# Patient Record
Sex: Male | Born: 1960 | Race: Black or African American | Hispanic: No | Smoking: Former smoker
Health system: Southern US, Community
[De-identification: ages and names within clinical notes are randomized; demographics above are authoritative.]

## PROBLEM LIST (undated history)

## (undated) DIAGNOSIS — E785 Hyperlipidemia, unspecified: Secondary | ICD-10-CM

## (undated) DIAGNOSIS — B019 Varicella without complication: Secondary | ICD-10-CM

## (undated) DIAGNOSIS — R51 Headache: Secondary | ICD-10-CM

## (undated) HISTORY — DX: Varicella without complication: B01.9

## (undated) HISTORY — DX: Hyperlipidemia, unspecified: E78.5

## (undated) HISTORY — DX: Headache: R51

## (undated) HISTORY — PX: WISDOM TOOTH EXTRACTION: SHX21

---

## 1999-11-06 ENCOUNTER — Ambulatory Visit (HOSPITAL_COMMUNITY): Admission: RE | Admit: 1999-11-06 | Discharge: 1999-11-06 | Payer: Self-pay | Admitting: *Deleted

## 2012-01-02 ENCOUNTER — Ambulatory Visit (INDEPENDENT_AMBULATORY_CARE_PROVIDER_SITE_OTHER): Payer: BC Managed Care – PPO | Admitting: Family Medicine

## 2012-01-02 VITALS — BP 158/73 | HR 67 | Temp 97.5°F | Resp 20 | Ht 66.5 in | Wt 241.0 lb

## 2012-01-02 DIAGNOSIS — M778 Other enthesopathies, not elsewhere classified: Secondary | ICD-10-CM

## 2012-01-02 DIAGNOSIS — M719 Bursopathy, unspecified: Secondary | ICD-10-CM

## 2012-01-02 DIAGNOSIS — M67919 Unspecified disorder of synovium and tendon, unspecified shoulder: Secondary | ICD-10-CM

## 2012-01-02 MED ORDER — PREDNISONE 20 MG PO TABS: ORAL_TABLET | ORAL | Status: DC

## 2012-01-02 NOTE — Patient Instructions (Addendum)
Achilles Tendinitis Tendinitis a swelling and soreness of the tendon. The pain in the tendon (cord-like structure which attaches muscle to bone) is produced by tiny tears and the inflammation present in that tendon. It commonly occurs at the shoulders, heels, and elbows. It is usually caused by overusing the tendon and joint involved. Achilles tendinitis involves the Achilles tendon. This is the large tendon in the back of the leg just above the foot. It attaches the large muscles of the lower leg to the heel bone (called calcaneus).  This diagnosis (learning what is wrong) is made by examination. X-rays will be generally be normal if only tendinitis is present. HOME CARE INSTRUCTIONS   Apply ice to the injury for 15 to 20 minutes, 3 to 4 times per day. Put the ice in a plastic bag and place a towel between the bag of ice and your skin.  Try to avoid use other than gentle range of motion while the tendon is painful. Do not resume use until instructed by your caregiver. Then begin use gradually. Do not increase use to the point of pain. If pain does develop, decrease use and continue the above measures. Gradually increase activities that do not cause discomfort until you gradually achieve normal use.  Only take over-the-counter or prescription medicines for pain, discomfort, or fever as directed by your caregiver. SEEK MEDICAL CARE IF:   Your pain and swelling increase or pain is uncontrolled with medications.  You develop new, unexplained problems (symptoms) or an increase of the symptoms that brought you to your caregiver.  You develop an inability to move your toes or foot, develop warmth and swelling in your foot, or begin running an unexplained temperature. MAKE SURE YOU:   Understand these instructions.  Will watch your condition.  Will get help right away if you are not doing well or get worse. Document Released: 10/03/2004 Document Revised: 03/18/2011 Document Reviewed:  08/12/2007 [facility] Patient Information 2013 [place], [place]. Bursitis Bursitis is a swelling and soreness (inflammation) of a fluid-filled sac (bursa) that overlies and protects a joint. It can be caused by injury, overuse of the joint, arthritis or infection. The joints most likely to be affected are the elbows, shoulders, hips and knees. HOME CARE INSTRUCTIONS   Apply ice to the affected area for 15 to 20 minutes each hour while awake for 2 days. Put the ice in a plastic bag and place a towel between the bag of ice and your skin.  Rest the injured joint as much as possible, but continue to put the joint through a full range of motion, 4 times per day. (The shoulder joint especially becomes rapidly "frozen" if not used.) When the pain lessens, begin normal slow movements and usual activities.  Only take over-the-counter or prescription medicines for pain, discomfort or fever as directed by your caregiver.  Your caregiver may recommend draining the bursa and injecting medicine into the bursa. This may help the healing process.  Follow all instructions for follow-up with your caregiver. This includes any orthopedic referrals, physical therapy and rehabilitation. Any delay in obtaining necessary care could result in a delay or failure of the bursitis to heal and chronic pain. SEEK IMMEDIATE MEDICAL CARE IF:   Your pain increases even during treatment.  You develop an oral temperature above 102 F (38.9 C) and have heat and inflammation over the involved bursa. MAKE SURE YOU:   Understand these instructions.  Will watch your condition.  Will get help right away if you  are not doing well or get worse. Document Released: 12/22/1999 Document Revised: 03/18/2011 Document Reviewed: 11/25/2008 [facility] Patient Information 2013 [place], [place].

## 2012-01-02 NOTE — Progress Notes (Signed)
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  Very painful lifting arm particularly after resting it for awhile.  Has had similar pain in the past but it resolved without significant treatment  Ibuprofen does not work  Pain now keeping him up at night  Objective:  NAD Painful arc Tender insertion of the supraspinatus  Assessment:  Tendonitis/bursitis left elbown  1. Tendonitis of shoulder, left  predniSONE (DELTASONE) 20 MG tablet

## 2012-08-19 ENCOUNTER — Other Ambulatory Visit (INDEPENDENT_AMBULATORY_CARE_PROVIDER_SITE_OTHER): Payer: BC Managed Care – PPO

## 2012-08-19 ENCOUNTER — Ambulatory Visit (INDEPENDENT_AMBULATORY_CARE_PROVIDER_SITE_OTHER): Payer: BC Managed Care – PPO | Admitting: Internal Medicine

## 2012-08-19 ENCOUNTER — Encounter: Payer: Self-pay | Admitting: Internal Medicine

## 2012-08-19 VITALS — BP 130/82 | HR 56 | Temp 98.6°F | Resp 16 | Ht 67.0 in | Wt 230.0 lb

## 2012-08-19 DIAGNOSIS — R519 Headache, unspecified: Secondary | ICD-10-CM | POA: Insufficient documentation

## 2012-08-19 DIAGNOSIS — R51 Headache: Secondary | ICD-10-CM

## 2012-08-19 DIAGNOSIS — E785 Hyperlipidemia, unspecified: Secondary | ICD-10-CM

## 2012-08-19 DIAGNOSIS — Z23 Encounter for immunization: Secondary | ICD-10-CM

## 2012-08-19 LAB — CBC WITH DIFFERENTIAL/PLATELET
Basophils Absolute: 0.1 10*3/uL (ref 0.0–0.1)
Basophils Relative: 1.4 % (ref 0.0–3.0)
Eosinophils Absolute: 0.2 10*3/uL (ref 0.0–0.7)
Lymphocytes Relative: 36 % (ref 12.0–46.0)
MCHC: 33.2 g/dL (ref 30.0–36.0)
MCV: 91.6 fl (ref 78.0–100.0)
Monocytes Absolute: 0.7 10*3/uL (ref 0.1–1.0)
Neutrophils Relative %: 44.4 % (ref 43.0–77.0)
Platelets: 204 10*3/uL (ref 150.0–400.0)
RBC: 5.33 Mil/uL (ref 4.22–5.81)
RDW: 14.6 % (ref 11.5–14.6)

## 2012-08-19 LAB — COMPREHENSIVE METABOLIC PANEL
ALT: 43 U/L (ref 0–53)
AST: 47 U/L — ABNORMAL HIGH (ref 0–37)
Albumin: 4.4 g/dL (ref 3.5–5.2)
Alkaline Phosphatase: 29 U/L — ABNORMAL LOW (ref 39–117)
BUN: 8 mg/dL (ref 6–23)
Calcium: 9.7 mg/dL (ref 8.4–10.5)
Chloride: 104 mEq/L (ref 96–112)
Potassium: 4 mEq/L (ref 3.5–5.1)
Sodium: 138 mEq/L (ref 135–145)
Total Protein: 8.3 g/dL (ref 6.0–8.3)

## 2012-08-19 LAB — SEDIMENTATION RATE: Sed Rate: 19 mm/hr (ref 0–22)

## 2012-08-19 LAB — LIPID PANEL
LDL Cholesterol: 143 mg/dL — ABNORMAL HIGH (ref 0–99)
Total CHOL/HDL Ratio: 7

## 2012-08-19 NOTE — Progress Notes (Signed)
Subjective:    Patient ID: Christopher Joyce, male    DOB: 05-09-1960, 52 y.o.   MRN: [id number]  Headache  This is a new problem. Episode onset: 3 months ago. The problem occurs intermittently (about one headache per week). The problem has been unchanged. The pain is located in the bilateral and frontal region. The pain does not radiate. The pain quality is not similar to prior headaches. The quality of the pain is described as aching. The pain is at a severity of 3/10. The pain is mild. Pertinent negatives include no abdominal pain, abnormal behavior, anorexia, back pain, blurred vision, coughing, dizziness, drainage, ear pain, eye pain, eye redness, facial sweating, fever, hearing loss, insomnia, loss of balance, muscle aches, nausea, neck pain, numbness, phonophobia, photophobia, scalp tenderness, seizures, sinus pressure, sore throat, swollen glands, tingling, tinnitus, visual change, vomiting, weakness or weight loss. Nothing aggravates the symptoms. He has tried NSAIDs for the symptoms. The treatment provided mild relief. His past medical history is significant for obesity. There is no history of cancer, cluster headaches, hypertension, immunosuppression, migraine headaches, migraines in the family, pseudotumor cerebri, recent head traumas, sinus disease or TMJ.      Review of Systems  Constitutional: Negative.  Negative for fever, chills, weight loss, diaphoresis, activity change, appetite change, fatigue and unexpected weight change.  HENT: Negative for hearing loss, ear pain, sore throat, neck pain, sinus pressure and tinnitus.   Eyes: Negative.  Negative for blurred vision, photophobia, pain and redness.  Respiratory: Negative.  Negative for cough, chest tightness, shortness of breath, wheezing and stridor.   Cardiovascular: Negative.  Negative for chest pain, palpitations and leg swelling.  Gastrointestinal: Negative.  Negative for nausea, vomiting, abdominal pain and anorexia.  Endocrine:  Negative.   Genitourinary: Negative.   Musculoskeletal: Negative.  Negative for myalgias, back pain, joint swelling, arthralgias and gait problem.  Skin: Negative.   Allergic/Immunologic: Negative.   Neurological: Positive for headaches. Negative for dizziness, tingling, seizures, weakness, numbness and loss of balance.  Hematological: Negative.  Negative for adenopathy. Does not bruise/bleed easily.  Psychiatric/Behavioral: Negative.  The patient does not have insomnia.        Objective:   Physical Exam  Vitals reviewed. Constitutional: He is oriented to person, place, and time. He appears well-developed and well-nourished. No distress.  HENT:  Head: Normocephalic and atraumatic.  Mouth/Throat: Oropharynx is clear and moist. No oropharyngeal exudate.  Eyes: Conjunctivae and EOM are normal. Pupils are equal, round, and reactive to light. Right eye exhibits no discharge. Left eye exhibits no discharge. No scleral icterus.  Neck: Normal range of motion. Neck supple. No JVD present. No tracheal deviation present. No thyromegaly present.  Cardiovascular: Normal rate, regular rhythm, normal heart sounds and intact distal pulses.  Exam reveals no gallop and no friction rub.   No murmur heard. Pulmonary/Chest: Effort normal and breath sounds normal. No stridor. No respiratory distress. He has no wheezes. He has no rales. He exhibits no tenderness.  Abdominal: Soft. Bowel sounds are normal. He exhibits no distension and no mass. There is no tenderness. There is no rebound and no guarding.  Musculoskeletal: Normal range of motion. He exhibits no edema and no tenderness.  Lymphadenopathy:    He has no cervical adenopathy.  Neurological: He is alert and oriented to person, place, and time. He has normal reflexes. He displays normal reflexes. No cranial nerve deficit. He exhibits normal muscle tone. Coordination normal.  Skin: Skin is warm and dry. No rash noted. He is  not diaphoretic. No erythema.  No pallor.  Psychiatric: He has a normal mood and affect. His behavior is normal. Judgment and thought content normal.     Lab Results  Component Value Date   WBC 4.9 08/19/2012   HGB 16.2 08/19/2012   HCT 48.8 08/19/2012   PLT 204.0 08/19/2012   GLUCOSE 90 08/19/2012   CHOL 196 08/19/2012   TRIG 115.0 08/19/2012   HDL 30.00* 08/19/2012   LDLCALC 143* 08/19/2012   ALT 43 08/19/2012   AST 47* 08/19/2012   NA 138 08/19/2012   K 4.0 08/19/2012   CL 104 08/19/2012   CREATININE 1.1 08/19/2012   BUN 8 08/19/2012   CO2 25 08/19/2012   TSH 1.00 08/19/2012       Assessment & Plan:

## 2012-08-19 NOTE — Patient Instructions (Signed)
General Headache Without Cause A headache is pain or discomfort felt around the head or neck area. The specific cause of a headache may not be found. There are many causes and types of headaches. A few common ones are:  Tension headaches.  Migraine headaches.  Cluster headaches.  Chronic daily headaches. HOME CARE INSTRUCTIONS   Keep all follow-up appointments with your caregiver or any specialist referral.  Only take over-the-counter or prescription medicines for pain or discomfort as directed by your caregiver.  Lie down in a dark, quiet room when you have a headache.  Keep a headache journal to find out what may trigger your migraine headaches. For example, write down:  What you eat and drink.  How much sleep you get.  Any change to your diet or medicines.  Try massage or other relaxation techniques.  Put ice packs or heat on the head and neck. Use these 3 to 4 times per day for 15 to 20 minutes each time, or as needed.  Limit stress.  Sit up straight, and do not tense your muscles.  Quit smoking if you smoke.  Limit alcohol use.  Decrease the amount of caffeine you drink, or stop drinking caffeine.  Eat and sleep on a regular schedule.  Get 7 to 9 hours of sleep, or as recommended by your caregiver.  Keep lights dim if bright lights bother you and make your headaches worse. SEEK MEDICAL CARE IF:   You have problems with the medicines you were prescribed.  Your medicines are not working.  You have a change from the usual headache.  You have nausea or vomiting. SEEK IMMEDIATE MEDICAL CARE IF:   Your headache becomes severe.  You have a fever.  You have a stiff neck.  You have loss of vision.  You have muscular weakness or loss of muscle control.  You start losing your balance or have trouble walking.  You feel faint or pass out.  You have severe symptoms that are different from your first symptoms. MAKE SURE YOU:   Understand these  instructions.  Will watch your condition.  Will get help right away if you are not doing well or get worse. Document Released: 12/24/2004 Document Revised: 03/18/2011 Document Reviewed: 01/09/2011 [employer] Patient Information 2014 [employer].  

## 2012-08-20 ENCOUNTER — Encounter: Payer: Self-pay | Admitting: Internal Medicine

## 2012-08-23 ENCOUNTER — Encounter: Payer: Self-pay | Admitting: Internal Medicine

## 2012-08-23 NOTE — Assessment & Plan Note (Signed)
His labs show no signs of inflammation, infection, vasculitis, or other metabolic causes for HA I have ordered an MRI to see if he has mass, tumor, cyst, hydrocephalus, etc He will continue the current meds for pain

## 2012-08-23 NOTE — Assessment & Plan Note (Signed)
He is not ready to treat this yet

## 2012-08-24 ENCOUNTER — Ambulatory Visit
Admission: RE | Admit: 2012-08-24 | Discharge: 2012-08-24 | Disposition: A | Discharge status: Home / Self Care | Payer: BC Managed Care – PPO | Source: Ambulatory Visit | Attending: Internal Medicine | Admitting: Internal Medicine

## 2012-08-24 DIAGNOSIS — R51 Headache: Secondary | ICD-10-CM

## 2012-08-25 ENCOUNTER — Other Ambulatory Visit: Payer: Self-pay | Admitting: Internal Medicine

## 2012-08-25 ENCOUNTER — Encounter: Payer: Self-pay | Admitting: Internal Medicine

## 2012-08-25 DIAGNOSIS — R930 Abnormal findings on diagnostic imaging of skull and head, not elsewhere classified: Secondary | ICD-10-CM

## 2012-09-03 ENCOUNTER — Encounter: Payer: Self-pay | Admitting: Neurology

## 2012-09-03 ENCOUNTER — Ambulatory Visit (INDEPENDENT_AMBULATORY_CARE_PROVIDER_SITE_OTHER): Payer: BC Managed Care – PPO | Admitting: Neurology

## 2012-09-03 VITALS — BP 151/86 | HR 59 | Ht 66.3 in | Wt 233.0 lb

## 2012-09-03 DIAGNOSIS — R51 Headache: Secondary | ICD-10-CM

## 2012-09-03 MED ORDER — NORTRIPTYLINE HCL 10 MG PO CAPS: ORAL_CAPSULE | ORAL | Status: DC

## 2012-09-03 NOTE — Progress Notes (Signed)
Reason for visit: Headache  Christopher Joyce is a 52 y.o. male  History of present illness:  Christopher Joyce is a 52 year old right-handed black male with a history of headaches that began about 3 months ago. The patient claims that prior to this, he never had headaches at any point in his life. The patient indicates that the headaches are bifrontal in nature, and are almost daily. The headaches are associated with a pressure sensation in the frontal regions bilaterally. Occasionally, he may note some neck stiffness. The patient does occasionally have some left shoulder numbness that developed within the last 1 week. The patient reports no weakness of the extremities, and he reports no dizziness, visual changes, nausea or vomiting, or gait instability. The patient denies problems controlling the bowels or the bladder. The patient has undergone blood work that includes a normal sedimentation rate. A MRI of the brain was done, and this shows minimal small vessel disease. The patient occasionally will take Advil for the headache with good improvement. The patient indicates that he sleeps well at night, and he has no fatigue during the day. The headaches often times are slightly worse in the morning, better in the evening. The patient is sent to this office for an evaluation.  Past Medical History  Diagnosis Date  . Headache(784.0)     History reviewed. No pertinent past surgical history.  Family History  Problem Relation Age of Onset  . Diabetes Mother   . Hyperlipidemia Father   . Early death Father   . Heart disease Father   . Stroke Paternal Grandfather   . Cancer Neg Hx   . Hypertension Neg Hx   . Kidney disease Neg Hx   . Early death Brother   . Heart disease Brother   . Hyperlipidemia Brother     Social history:  reports that he has quit smoking. He has never used smokeless tobacco. He reports that he does not drink alcohol or use illicit drugs.  Medications:  No current  outpatient prescriptions on file prior to visit.   No current facility-administered medications on file prior to visit.     No Known Allergies  ROS:  Out of a complete 14 system review of symptoms, the patient complains only of the following symptoms, and all other reviewed systems are negative.  Headache Dizziness  Blood pressure 151/86, pulse 59, height 5' 6.3" (1.684 m), weight 233 lb (105.688 kg).  Physical Exam  General: The patient is alert and cooperative at the time of the examination. The patient is minimally obese.  Head: Pupils are equal, round, and reactive to light. Discs are flat bilaterally.  Neck: The neck is supple, no carotid bruits are noted.  Respiratory: The respiratory examination is clear.  Cardiovascular: The cardiovascular examination reveals a regular rate and rhythm, no obvious murmurs or rubs are noted.  Neuromuscular: The patient has excellent range of motion of the cervical spine. No crepitus is noted in the temporomandibular joints.  Skin: Extremities are without significant edema.  Neurologic Exam  Mental status:  Cranial nerves: Facial symmetry is present. There is good sensation of the face to pinprick and soft touch bilaterally. The strength of the facial muscles and the muscles to head turning and shoulder shrug are normal bilaterally. Speech is well enunciated, no aphasia or dysarthria is noted. Extraocular movements are full. Visual fields are full.  Motor: The motor testing reveals 5 over 5 strength of all 4 extremities. Good symmetric motor tone is noted throughout.  Sensory: Sensory testing is intact to pinprick, soft touch, vibration sensation, and position sense on all 4 extremities. No evidence of extinction is noted.  Coordination: Cerebellar testing reveals good finger-nose-finger and heel-to-shin bilaterally.  Gait and station: Gait is normal. Tandem gait is normal. Romberg is negative. No drift is seen.  Reflexes: Deep  tendon reflexes are symmetric and normal bilaterally. Toes are downgoing bilaterally.   Assessment/Plan:  1. Probable muscle tension headache  The patient has had an adequate workup with MRI of the brain, and blood work. The patient will be placed on nortriptyline, beginning at 10 mg at night, and then going to 20 mg at night after one week. The patient will followup in about 4 months. If the patient is tolerating the medication, but he needs a higher dose, he will contact our office.  Marlan Palau MD 09/03/2012 8:59 PM  [facility] [address] [place], [place] [address]  Phone [phone] Fax [phone]

## 2012-11-12 ENCOUNTER — Other Ambulatory Visit: Payer: Self-pay

## 2013-01-12 ENCOUNTER — Ambulatory Visit (INDEPENDENT_AMBULATORY_CARE_PROVIDER_SITE_OTHER): Payer: Managed Care, Other (non HMO) | Admitting: Nurse Practitioner

## 2013-01-12 ENCOUNTER — Encounter: Payer: Self-pay | Admitting: Nurse Practitioner

## 2013-01-12 VITALS — BP 127/73 | HR 68 | Ht 66.5 in | Wt 234.0 lb

## 2013-01-12 DIAGNOSIS — R51 Headache: Secondary | ICD-10-CM

## 2013-01-12 NOTE — Progress Notes (Signed)
I have read the note, and I agree with the clinical assessment and plan.  WILLIS,CHARLES KEITH   

## 2013-01-12 NOTE — Patient Instructions (Signed)
Continue Nortriptyline at 20mg   At bedtime F/U in 8 months

## 2013-01-12 NOTE — Progress Notes (Signed)
[facility]  PATIENT: Christopher Joyce DOB: December 14, 1960   REASON FOR VISIT: follow up for headache   HISTORY OF PRESENT ILLNESS:Mr. Christopher Joyce, 53 year old black male returns for followup. He has a history of headaches since May of 2014. He was initially evaluated by Dr. Jannifer Franklin 09/03/2012 and placed on nortriptyline at that time. He is currently taking 20 mg at bedtime. His headaches have gone from daily to 2 headaches per month. He now thinks these headaches are due to stress. He denies any side effects to the nortriptyline. He has no new complaints   HISTORY:of headaches that began in May 2014.  The patient claims that prior to this, he never had headaches at any point in his life. The patient indicates that the headaches are bifrontal in nature, and are almost daily. The headaches are associated with a pressure sensation in the frontal regions bilaterally. Occasionally, he may note some neck stiffness. The patient does occasionally have some left shoulder numbness that developed within the last 1 week. The patient reports no weakness of the extremities, and he reports no dizziness, visual changes, nausea or vomiting, or gait instability. The patient denies problems controlling the bowels or the bladder. The patient has undergone blood work that includes a normal sedimentation rate. A MRI of the brain was done, and this shows minimal small vessel disease. The patient occasionally will take Advil for the headache with good improvement. The patient indicates that he sleeps well at night, and he has no fatigue during the day. The headaches often times are slightly worse in the morning, better in the evening. The patient is sent to this office for an evaluation.   REVIEW OF SYSTEMS: Full 14 system review of systems performed and notable only for those listed, all others are neg:  Constitutional: N/A  Cardiovascular: N/A  Ear/Nose/Throat: N/A  Skin: N/A  Eyes: N/A  Respiratory:  N/A  Gastroitestinal: N/A  Hematology/Lymphatic: N/A  Endocrine: N/A Musculoskeletal:N/A  Allergy/Immunology: N/A  Neurological: N/A Psychiatric: N/A   ALLERGIES: No Known Allergies  HOME MEDICATIONS: Outpatient Prescriptions Prior to Visit  Medication Sig Dispense Refill  . aspirin 81 MG tablet Take 81 mg by mouth daily.      . nortriptyline (PAMELOR) 10 MG capsule Take one capsule at night for one week, then take 2 capsules at night  60 capsule  3  . Omega-3 Fatty Acids (FISH OIL) 1200 MG CAPS Take by mouth daily.       No facility-administered medications prior to visit.    PAST MEDICAL HISTORY: Past Medical History  Diagnosis Date  . Headache(784.0)     PAST SURGICAL HISTORY: Past Surgical History  Procedure Laterality Date  . None      FAMILY HISTORY: Family History  Problem Relation Age of Onset  . Diabetes Mother   . Hyperlipidemia Father   . Early death Father   . Heart disease Father   . Stroke Paternal Grandfather   . Cancer Neg Hx   . Hypertension Neg Hx   . Kidney disease Neg Hx   . Early death Brother   . Heart disease Brother   . Hyperlipidemia Brother     SOCIAL HISTORY: History   Social History  . Marital Status: Divorced    Spouse Name: N/A    Number of Children: 23  . Years of Education: Assoc.5   Occupational History  .      [employer]   Social History Main Topics  . Smoking status: Former  Smoker  . Smokeless tobacco: Never Used     Comment: quit smoking 2008  . Alcohol Use: No  . Drug Use: No  . Sexual Activity: Yes   Other Topics Concern  . Not on file   Social History Narrative   Patient lives at home with his mom Christopher Joyce.    Patient has 5 children.    Patient is Divorced.    Patient is working full-time.    Patient is right-handed     PHYSICAL EXAM  Filed Vitals:   01/12/13 1114  BP: 127/73  Pulse: 68  Height: 5' 6.5" (1.689 m)  Weight: 234 lb (106.142 kg)   Body mass index is 37.21  kg/(m^2).  Generalized: Well developed,minimally obese male  in no acute distress   Neurological examination   Mentation: Alert oriented to time, place, history taking. Follows all commands speech and language fluent  Cranial nerve II-XII: Pupils were equal round reactive to light extraocular movements were full, visual field were full on confrontational test. Facial sensation and strength were normal. hearing was intact to finger rubbing bilaterally. Uvula tongue midline. head turning and shoulder shrug were normal and symmetric.Tongue protrusion into cheek strength was normal. Motor: normal bulk and tone, full strength in the BUE, BLE, fine finger movements normal, no pronator drift. No focal weakness Coordination: finger-nose-finger, heel-to-shin bilaterally, no dysmetria Reflexes: Brachioradialis 2/2, biceps 2/2, triceps 2/2, patellar 2/2, Achilles 2/2, plantar responses were flexor bilaterally. Gait and Station: Rising up from seated position without assistance, normal stance,  moderate stride, good arm swing, smooth turning, able to perform tiptoe, and heel walking without difficulty. Tandem gait is steady  DIAGNOSTIC DATA (LABS, IMAGING, TESTING) - I reviewed patient records, labs, notes, testing and imaging myself where available.  Lab Results  Component Value Date   WBC 4.9 08/19/2012   HGB 16.2 08/19/2012   HCT 48.8 08/19/2012   MCV 91.6 08/19/2012   PLT 204.0 08/19/2012      Component Value Date/Time   NA 138 08/19/2012 1131   K 4.0 08/19/2012 1131   CL 104 08/19/2012 1131   CO2 25 08/19/2012 1131   GLUCOSE 90 08/19/2012 1131   BUN 8 08/19/2012 1131   CREATININE 1.1 08/19/2012 1131   CALCIUM 9.7 08/19/2012 1131   PROT 8.3 08/19/2012 1131   ALBUMIN 4.4 08/19/2012 1131   AST 47* 08/19/2012 1131   ALT 43 08/19/2012 1131   ALKPHOS 29* 08/19/2012 1131   BILITOT 0.7 08/19/2012 1131   Lab Results  Component Value Date   CHOL 196 08/19/2012   HDL 30.00* 08/19/2012   LDLCALC 143* 08/19/2012    TRIG 115.0 08/19/2012   CHOLHDL 7 08/19/2012     Lab Results  Component Value Date   TSH 1.00 08/19/2012      ASSESSMENT AND PLAN  53 y.o. year old male  has a past medical history of Headache(784.0). here to followup. He was placed on nortriptyline after his initial evaluation and his headaches have gone from daily to 2 a month  Continue Nortriptyline at 20mg   At bedtime F/U in 8 months Dennie Bible, [facility], [facility], [facility] [address] [place], [place] [address] [phone]

## 2013-01-22 ENCOUNTER — Ambulatory Visit (INDEPENDENT_AMBULATORY_CARE_PROVIDER_SITE_OTHER): Payer: Managed Care, Other (non HMO) | Admitting: Internal Medicine

## 2013-01-22 ENCOUNTER — Encounter: Payer: Self-pay | Admitting: Internal Medicine

## 2013-01-22 VITALS — BP 157/85 | HR 64 | Temp 98.4°F | Wt 235.0 lb

## 2013-01-22 DIAGNOSIS — R030 Elevated blood-pressure reading, without diagnosis of hypertension: Secondary | ICD-10-CM | POA: Insufficient documentation

## 2013-01-22 DIAGNOSIS — E785 Hyperlipidemia, unspecified: Secondary | ICD-10-CM

## 2013-01-22 DIAGNOSIS — R079 Chest pain, unspecified: Secondary | ICD-10-CM

## 2013-01-22 DIAGNOSIS — Z8249 Family history of ischemic heart disease and other diseases of the circulatory system: Secondary | ICD-10-CM

## 2013-01-22 LAB — TROPONIN I: Troponin I: 0.01 ng/mL (ref <0.06)

## 2013-01-22 LAB — D-DIMER, QUANTITATIVE: D-Dimer, Quant: 0.33 ug/mL-FEU (ref 0.00–0.48)

## 2013-01-22 MED ORDER — METOPROLOL TARTRATE 25 MG PO TABS: 25.0000 mg | ORAL_TABLET | Freq: Two times a day (BID) | ORAL | Status: DC

## 2013-01-22 NOTE — Progress Notes (Signed)
   Subjective:    Patient ID: Christopher Joyce, male    DOB: 23-Jul-1960, 53 y.o.   MRN: [id number]  HPI  Presents with c/o intermittent infraclavicular pain, both laterally @ the shoulder and medially . Pain described as dull pressure, lasting seconds at each interval. He reports he first noted this pain approximately 4 months ago and had not had it again until 01/18/13 & intermittently since , most recently this am. He has taken nothing for the pain and cannot identify aggravating or mediating factors. No recent changes in physical activity or stress level.    A heart healthy diet is followed; no regular exercise . Family history is STRONGLY + for premature coronary disease in father & brother. No advanced cholesterol testing to date . To date no statin taken.  Low dose ASA taken   Review of Systems  He specifically denies dyspepsia, dysphagia, abdominal pain, unexplained weight loss, melena, rectal bleeding, hematemesis, hemoptysis, radiculopathy-type pain, edema, paroxysmal nocturnal dyspnea, claudication, syncope, or rash/lesions in the area of the chest pain.  He has been told by Associates he does snore; no definite history of apnea    Objective:   Physical Exam  Appears healthy and well-nourished & in no acute distress  No significant pharyngeal erythema. The oropharynx is crowded  No carotid bruits are present.No neck pain distention present at 10 - 15 degrees. Thyroid normal to palpation  Heart rhythm and rate are normal with no significant murmurs or gallops.Accentuated S2  Chest is clear with no increased work of breathing  There is no evidence of aortic aneurysm or renal artery bruits  Abdomen soft with no organomegaly or masses. No HJR  No clubbing, cyanosis or edema present.  Pedal pulses are intact . Homan's negative   No ischemic skin changes are present . Nails healthy    Alert and oriented. Strength, tone normal          Assessment & Plan:  #1  atypical left chest pain  #2 strong family history of premature coronary disease  #3 snoring, rule out sleep apnea once the chest pain has been evaluated  Plan: D-dimer and troponin 1 as he did have an episode of pain this morning. He was asked not to exercise until stress test to be completed. Advanced cholesterol testing will be performed to assess the risk associated with his LDL of 143.

## 2013-01-22 NOTE — Progress Notes (Signed)
Pre visit review using our clinic review tool, if applicable. No additional management support is needed unless otherwise documented below in the visit note. 

## 2013-01-22 NOTE — Patient Instructions (Addendum)
Minimal Blood Pressure Goal= AVERAGE < 140/90;  Ideal is an AVERAGE < 135/85. This AVERAGE should be calculated from @ least 5-7 BP readings taken @ different times of day on different days of week. You should not respond to isolated BP readings , but rather the AVERAGE for that week .Please bring your  blood pressure cuff to office visits to verify that it is reliable.It  can also be checked against the blood pressure device at the pharmacy. Finger or wrist cuffs are not dependable; an arm cuff is.Fill the  prescription for the BP medication if BP NOT @ goal based on  7 to 14 day average.  Do not take the blood pressure pill the morning of the stress test. Take the EKG to any emergency room or preop visits. There are nonspecific changes; as long as there is no new change these are not clinically significant . If the old EKG is not available for comparison; it may result in unnecessary hospitalization for observation with significant unnecessary expense.

## 2013-01-25 ENCOUNTER — Telehealth: Payer: Self-pay | Admitting: *Deleted

## 2013-01-25 NOTE — Telephone Encounter (Signed)
[facility] Triage Call Report Triage Record Num: [id number] Operator: Truddie Crumble Patient Name: Christopher Joyce Call Date & Time: 01/22/2013 6:57:56PM Patient Phone: PCP: Unice Cobble Patient Gender: Male PCP Fax : [phone] Patient DOB: [birth year] Practice Name: [facility] Reason for Call: Caller: Janett Billow; PCP: Hopper,William.; CB#: [phone] is calling from [facility] regarding a CBC ordered by Linna Darner, William.Ordered stat--all results w/i normal limits:D-dimer 0.33, Triponin 0.01. Faxed to office for review. Protocol(s) Used: Office Note Recommended Outcome per Protocol: Information Noted and Sent to Office Reason for Outcome: Caller information to office Care Advice: ~

## 2013-09-14 ENCOUNTER — Ambulatory Visit: Payer: Managed Care, Other (non HMO) | Admitting: Nurse Practitioner

## 2013-09-28 ENCOUNTER — Encounter: Payer: Self-pay | Admitting: Internal Medicine

## 2013-09-28 ENCOUNTER — Ambulatory Visit (INDEPENDENT_AMBULATORY_CARE_PROVIDER_SITE_OTHER): Payer: Managed Care, Other (non HMO) | Admitting: Internal Medicine

## 2013-09-28 VITALS — BP 140/82 | HR 65 | Temp 98.5°F | Wt 228.2 lb

## 2013-09-28 DIAGNOSIS — IMO0002 Reserved for concepts with insufficient information to code with codable children: Secondary | ICD-10-CM

## 2013-09-28 DIAGNOSIS — M5416 Radiculopathy, lumbar region: Secondary | ICD-10-CM

## 2013-09-28 MED ORDER — PREDNISONE 20 MG PO TABS: 20.0000 mg | ORAL_TABLET | Freq: Two times a day (BID) | ORAL | Status: DC

## 2013-09-28 MED ORDER — GABAPENTIN 100 MG PO CAPS: ORAL_CAPSULE | ORAL | Status: DC

## 2013-09-28 NOTE — Patient Instructions (Signed)
Use an anti-inflammatory cream such as Aspercreme or Zostrix cream twice a day to the affected area as needed. In lieu of this warm moist compresses or  hot water bottle can be used. Do not apply ice .The best exercises for the low back include freestyle swimming, stretch aerobics, and yoga.Cybex & Nautilus machines rather than dead weights are better for the back.

## 2013-09-28 NOTE — Progress Notes (Signed)
   Subjective:    Patient ID: Christopher Joyce, male    DOB: 02/07/1960, 53 y.o.   MRN: [id number]  HPI  He began to have gradual onset of pain in the right buttocks 7-10 days ago without specific trigger or injury. He specifically denies any heavy lifting or repetitive motion  The discomfort now is described as sharp and electric, worse while standing or walking. Aleve helps minimally. Sitting or lying supine is beneficial  The pain can radiate as far as the right knee down the posterior thigh.  Past history is negative for any musculoskeletal issues except for left shoulder bursitis   Review of Systems   He specifically denies fever, chills, sweats or unexplained weight loss  He has no abdominal pain, melena, rectal bleeding  He also denies dysuria, pyuria, or hematuria  He has no weakness, numbness, or tingling in the lower extremities.  There is no change in color or temperature of the skin in the area of the symptoms he also denies loss control bladder or bowels      Objective:   Physical Exam   Positive or pertinent findings: Include: He has significant crepitus of knees greater on the right than the left. Musculoskeletal/Extremities: Gait (including heel & toe ); station; range of motion; stability; muscle strength; and  muscle tone are normal. Nail health is good. There are  no significant deformities of the digits. No cyanosis, clubbing or edema are present.DTRs =& WNL. Negative SLR.  General appearance :adequately nourished; in no distress.  Eyes: No conjunctival inflammation or scleral icterus is present.  Heart:  Normal rate and regular rhythm. S1 and S2 normal without gallop, murmur, click, rub or other extra sounds     Lungs:Chest clear to auscultation; no wheezes, rhonchi,rales ,or rubs present.No increased work of breathing.   Abdomen: bowel sounds normal, soft and non-tender without masses, organomegaly or hernias noted.  No guarding or rebound. No flank  tenderness to percussion.  Skin:Warm & dry.  Intact without suspicious lesions or rashes ; no jaundice or tenting  Lymphatic: No lymphadenopathy is noted about the head, neck, axilla           Assessment & Plan:  #1 lumbosacral  radiculopathy See orders

## 2013-09-28 NOTE — Progress Notes (Signed)
Pre visit review using our clinic review tool, if applicable. No additional management support is needed unless otherwise documented below in the visit note. 

## 2013-10-04 ENCOUNTER — Ambulatory Visit (INDEPENDENT_AMBULATORY_CARE_PROVIDER_SITE_OTHER): Payer: Managed Care, Other (non HMO) | Admitting: Internal Medicine

## 2013-10-04 ENCOUNTER — Encounter: Payer: Self-pay | Admitting: Internal Medicine

## 2013-10-04 ENCOUNTER — Ambulatory Visit (INDEPENDENT_AMBULATORY_CARE_PROVIDER_SITE_OTHER)
Admission: RE | Admit: 2013-10-04 | Discharge: 2013-10-04 | Disposition: A | Discharge status: Home / Self Care | Payer: Managed Care, Other (non HMO) | Source: Ambulatory Visit | Attending: Internal Medicine | Admitting: Internal Medicine

## 2013-10-04 VITALS — BP 138/80 | HR 60 | Temp 98.7°F | Resp 16 | Wt 237.0 lb

## 2013-10-04 DIAGNOSIS — IMO0002 Reserved for concepts with insufficient information to code with codable children: Secondary | ICD-10-CM

## 2013-10-04 DIAGNOSIS — M5416 Radiculopathy, lumbar region: Secondary | ICD-10-CM | POA: Insufficient documentation

## 2013-10-04 MED ORDER — OXYCODONE-ACETAMINOPHEN 7.5-325 MG PO TABS: 1.0000 | ORAL_TABLET | ORAL | Status: DC | PRN

## 2013-10-04 NOTE — Assessment & Plan Note (Signed)
Will try percocet for pain I am concerned he has spinal stenosis, have ordered an MRI to get more detail about this

## 2013-10-04 NOTE — Patient Instructions (Signed)
Back Pain, Adult Low back pain is very common. About 1 in 5 people have back pain.The cause of low back pain is rarely dangerous. The pain often gets better over time.About half of people with a sudden onset of back pain feel better in just 2 weeks. About 8 in 10 people feel better by 6 weeks.  CAUSES Some common causes of back pain include:  Strain of the muscles or ligaments supporting the spine.  Wear and tear (degeneration) of the spinal discs.  Arthritis.  Direct injury to the back. DIAGNOSIS Most of the time, the direct cause of low back pain is not known.However, back pain can be treated effectively even when the exact cause of the pain is unknown.Answering your caregiver's questions about your overall health and symptoms is one of the most accurate ways to make sure the cause of your pain is not dangerous. If your caregiver needs more information, he or she may order lab work or imaging tests (X-rays or MRIs).However, even if imaging tests show changes in your back, this usually does not require surgery. HOME CARE INSTRUCTIONS For many people, back pain returns.Since low back pain is rarely dangerous, it is often a condition that people can learn to manageon their own.   Remain active. It is stressful on the back to sit or stand in one place. Do not sit, drive, or stand in one place for more than 30 minutes at a time. Take short walks on level surfaces as soon as pain allows.Try to increase the length of time you walk each day.  Do not stay in bed.Resting more than 1 or 2 days can delay your recovery.  Do not avoid exercise or work.Your body is made to move.It is not dangerous to be active, even though your back may hurt.Your back will likely heal faster if you return to being active before your pain is gone.  Pay attention to your body when you bend and lift. Many people have less discomfortwhen lifting if they bend their knees, keep the load close to their bodies,and  avoid twisting. Often, the most comfortable positions are those that put less stress on your recovering back.  Find a comfortable position to sleep. Use a firm mattress and lie on your side with your knees slightly bent. If you lie on your back, put a pillow under your knees.  Only take over-the-counter or prescription medicines as directed by your caregiver. Over-the-counter medicines to reduce pain and inflammation are often the most helpful.Your caregiver may prescribe muscle relaxant drugs.These medicines help dull your pain so you can more quickly return to your normal activities and healthy exercise.  Put ice on the injured area.  Put ice in a plastic bag.  Place a towel between your skin and the bag.  Leave the ice on for 15-20 minutes, 03-04 times a day for the first 2 to 3 days. After that, ice and heat may be alternated to reduce pain and spasms.  Ask your caregiver about trying back exercises and gentle massage. This may be of some benefit.  Avoid feeling anxious or stressed.Stress increases muscle tension and can worsen back pain.It is important to recognize when you are anxious or stressed and learn ways to manage it.Exercise is a great option. SEEK MEDICAL CARE IF:  You have pain that is not relieved with rest or medicine.  You have pain that does not improve in 1 week.  You have new symptoms.  You are generally not feeling well. SEEK   IMMEDIATE MEDICAL CARE IF:   You have pain that radiates from your back into your legs.  You develop new bowel or bladder control problems.  You have unusual weakness or numbness in your arms or legs.  You develop nausea or vomiting.  You develop abdominal pain.  You feel faint. Document Released: 12/24/2004 Document Revised: 06/25/2011 Document Reviewed: 04/27/2013 ExitCare Patient Information 2015 [employer]. This information is not intended to replace advice given to you by your health care provider. Make sure you  discuss any questions you have with your health care provider.  

## 2013-10-04 NOTE — Progress Notes (Signed)
Subjective:    Patient ID: Christopher Joyce, male    DOB: Oct 11, 1960, 53 y.o.   MRN: [id number]  Back Pain This is a recurrent problem. The current episode started more than 1 month ago. The problem occurs intermittently. The problem has been gradually worsening since onset. The pain is present in the lumbar spine. The quality of the pain is described as shooting and aching. The pain radiates to the right knee. The pain is at a severity of 5/10. The pain is moderate. The pain is worse during the day. The symptoms are aggravated by standing. Stiffness is present at night. Associated symptoms include leg pain. Pertinent negatives include no abdominal pain, bladder incontinence, bowel incontinence, chest pain, dysuria, fever, headaches, numbness, paresis, paresthesias, pelvic pain, perianal numbness, tingling, weakness or weight loss. Risk factors include obesity and lack of exercise. He has tried NSAIDs (prednisone, gabapentin) for the symptoms. The treatment provided no relief.      Review of Systems  Constitutional: Negative.  Negative for fever, chills, weight loss, diaphoresis, appetite change and fatigue.  HENT: Negative.   Eyes: Negative.   Respiratory: Negative.  Negative for apnea, cough, choking, chest tightness, shortness of breath, wheezing and stridor.   Cardiovascular: Negative.  Negative for chest pain, palpitations and leg swelling.  Gastrointestinal: Negative.  Negative for nausea, vomiting, abdominal pain, diarrhea, constipation and bowel incontinence.  Endocrine: Negative.   Genitourinary: Negative.  Negative for bladder incontinence, dysuria and pelvic pain.  Musculoskeletal: Positive for back pain. Negative for arthralgias, gait problem, joint swelling, myalgias, neck pain and neck stiffness.  Skin: Negative.  Negative for rash.  Allergic/Immunologic: Negative.   Neurological: Negative.  Negative for dizziness, tingling, weakness, numbness, headaches and paresthesias.    Hematological: Negative.  Negative for adenopathy. Does not bruise/bleed easily.  Psychiatric/Behavioral: Negative.        Objective:   Physical Exam  Vitals reviewed. Constitutional: He is oriented to person, place, and time. He appears well-developed and well-nourished. No distress.  HENT:  Head: Normocephalic and atraumatic.  Mouth/Throat: Oropharynx is clear and moist. No oropharyngeal exudate.  Eyes: Conjunctivae are normal. Right eye exhibits no discharge. Left eye exhibits no discharge. No scleral icterus.  Neck: Normal range of motion. Neck supple. No JVD present. No tracheal deviation present. No thyromegaly present.  Cardiovascular: Normal rate, regular rhythm, normal heart sounds and intact distal pulses.  Exam reveals no gallop and no friction rub.   No murmur heard. Pulmonary/Chest: Effort normal and breath sounds normal. No stridor. No respiratory distress. He has no wheezes. He has no rales. He exhibits no tenderness.  Abdominal: Soft. Bowel sounds are normal. He exhibits no distension and no mass. There is no tenderness. There is no rebound and no guarding.  Musculoskeletal: Normal range of motion. He exhibits no edema and no tenderness.       Lumbar back: Normal. He exhibits normal range of motion, no tenderness, no bony tenderness, no swelling, no edema, no deformity, no laceration, no pain, no spasm and normal pulse.  Lymphadenopathy:    He has no cervical adenopathy.  Neurological: He is alert and oriented to person, place, and time. He has normal strength. He displays no atrophy, no tremor and normal reflexes. No cranial nerve deficit or sensory deficit. He exhibits normal muscle tone. He displays a negative Romberg sign. He displays no seizure activity. Coordination and gait normal.  Reflex Scores:      Tricep reflexes are 1+ on the right side and 1+ on  the left side.      Bicep reflexes are 1+ on the right side and 1+ on the left side.      Brachioradialis reflexes  are 1+ on the right side and 1+ on the left side.      Patellar reflexes are 1+ on the right side and 1+ on the left side.      Achilles reflexes are 1+ on the right side and 1+ on the left side. Neg SLR in BLE  Skin: Skin is warm and dry. No rash noted. He is not diaphoretic. No erythema. No pallor.          Assessment & Plan:

## 2013-10-11 ENCOUNTER — Ambulatory Visit (INDEPENDENT_AMBULATORY_CARE_PROVIDER_SITE_OTHER): Payer: Managed Care, Other (non HMO) | Admitting: Adult Health

## 2013-10-11 ENCOUNTER — Encounter: Payer: Self-pay | Admitting: Adult Health

## 2013-10-11 VITALS — BP 156/81 | HR 79 | Ht 67.0 in | Wt 222.0 lb

## 2013-10-11 DIAGNOSIS — G8929 Other chronic pain: Secondary | ICD-10-CM

## 2013-10-11 DIAGNOSIS — R51 Headache: Secondary | ICD-10-CM

## 2013-10-11 NOTE — Progress Notes (Signed)
I have read the note, and I agree with the clinical assessment and plan.  Christopher Joyce,Christopher Joyce   

## 2013-10-11 NOTE — Progress Notes (Signed)
PATIENT: Christopher Joyce DOB: 1960-04-26  REASON FOR VISIT: follow up HISTORY FROM: patient  HISTORY OF PRESENT ILLNESS: Christopher Joyce is a 53 year old male with a history of migraines. He returns today for follow-up. He is currently taking nortriptyline 20 mg daily but will only take it when he has a headache. He reports that he has approximately 1 headache every other month. He states that his headaches have improved drastically and is not sure that he needs the nortriptyline. He has been having low back pain that radiates down the right leg. His PCP has ordered an MRI of the lumbar spine to evaluate. Right now he is taking percocet for the back pain. No new neurological complaints.   HISTORY 01/12/13 (CM): 53 year old black male returns for followup. He has a history of headaches since May of 2014. He was initially evaluated by Dr. Jannifer Franklin 09/03/2012 and placed on nortriptyline at that time. He is currently taking 20 mg at bedtime. His headaches have gone from daily to 2 headaches per month. He now thinks these headaches are due to stress. He denies any side effects to the nortriptyline. He has no new complaints  HISTORY:of headaches that began in May 2014. The patient claims that prior to this, he never had headaches at any point in his life. The patient indicates that the headaches are bifrontal in nature, and are almost daily. The headaches are associated with a pressure sensation in the frontal regions bilaterally. Occasionally, he may note some neck stiffness. The patient does occasionally have some left shoulder numbness that developed within the last 1 week. The patient reports no weakness of the extremities, and he reports no dizziness, visual changes, nausea or vomiting, or gait instability. The patient denies problems controlling the bowels or the bladder. The patient has undergone blood work that includes a normal sedimentation rate. A MRI of the brain was done, and this shows minimal small  vessel disease. The patient occasionally will take Advil for the headache with good improvement. The patient indicates that he sleeps well at night, and he has no fatigue during the day. The headaches often times are slightly worse in the morning, better in the evening. The patient is sent to this office for an evaluation  REVIEW OF SYSTEMS: Full 14 system review of systems performed and notable only for:  Constitutional: N/A  Eyes: N/A Ear/Nose/Throat: N/A  Skin: N/A  Cardiovascular: N/A  Respiratory: N/A  Gastrointestinal: N/A  Genitourinary: N/A Hematology/Lymphatic: N/A  Endocrine: N/A Musculoskeletal:back pain Allergy/Immunology: N/A  Neurological: N/A Psychiatric: N/A Sleep: N/A   ALLERGIES: No Known Allergies  HOME MEDICATIONS: Outpatient Prescriptions Prior to Visit  Medication Sig Dispense Refill  . aspirin 81 MG tablet Take 81 mg by mouth daily.      . metoprolol tartrate (LOPRESSOR) 25 MG tablet Take 1 tablet (25 mg total) by mouth 2 (two) times daily.  60 tablet  2  . nortriptyline (PAMELOR) 10 MG capsule Take 10 mg by mouth as needed. Take one capsule at night for one week, then take 2 capsules at night      . Omega-3 Fatty Acids (FISH OIL) 1200 MG CAPS Take by mouth daily.      Marland Kitchen oxyCODONE-acetaminophen (PERCOCET) 7.5-325 MG per tablet Take 1 tablet by mouth every 4 (four) hours as needed for pain.  50 tablet  0   No facility-administered medications prior to visit.    PAST MEDICAL HISTORY: Past Medical History  Diagnosis Date  . Headache(784.0)  PAST SURGICAL HISTORY: Past Surgical History  Procedure Laterality Date  . None      FAMILY HISTORY: Family History  Problem Relation Age of Onset  . Diabetes Mother   . Hyperlipidemia Father   . Heart attack Father 72  . Stroke Paternal Grandfather   . Cancer Neg Hx   . Hypertension Neg Hx   . Kidney disease Neg Hx   . Heart attack Brother 63  . Hyperlipidemia Brother     SOCIAL HISTORY: History    Social History  . Marital Status: Divorced    Spouse Name: N/A    Number of Children: 90  . Years of Education: Assoc.5   Occupational History  .      [employer]   Social History Main Topics  . Smoking status: Former Research scientist (life sciences)  . Smokeless tobacco: Never Used     Comment: quit smoking 2008  . Alcohol Use: No  . Drug Use: No  . Sexual Activity: Yes   Other Topics Concern  . Not on file   Social History Narrative   Patient lives at home with his mom Christopher Joyce.    Patient has 5 children.    Patient is Divorced.    Patient is working full-time.    Patient is right-handed      PHYSICAL EXAM  Filed Vitals:   10/11/13 1402  BP: 156/81  Pulse: 79  Height: 5\' 7"  (1.702 m)  Weight: 222 lb (100.699 kg)   Body mass index is 34.76 kg/(m^2).  Generalized: Well developed, in no acute distress   Neurological examination  Mentation: Alert oriented to time, place, history taking. Follows all commands speech and language fluent Cranial nerve II-XII: Pupils were equal round reactive to light. Extraocular movements were full, visual field were full on confrontational test. Facial sensation and strength were normal.Uvula tongue midline. Head turning and shoulder shrug  were normal and symmetric. Motor: The motor testing reveals 5 over 5 strength of all 4 extremities. Good symmetric motor tone is noted throughout.  Sensory: Sensory testing is intact to soft touch on all 4 extremities. No evidence of extinction is noted.  Coordination: Cerebellar testing reveals good finger-nose-finger and heel-to-shin bilaterally.  Gait and station: Gait is normal. Tandem gait is normal. Romberg is negative. No drift is seen.  Reflexes: Deep tendon reflexes are symmetric and normal bilaterally.    DIAGNOSTIC DATA (LABS, IMAGING, TESTING) - I reviewed patient records, labs, notes, testing and imaging myself where available.  Lab Results  Component Value Date   WBC 4.9 08/19/2012   HGB 16.2 08/19/2012    HCT 48.8 08/19/2012   MCV 91.6 08/19/2012   PLT 204.0 08/19/2012      Component Value Date/Time   NA 138 08/19/2012 1131   K 4.0 08/19/2012 1131   CL 104 08/19/2012 1131   CO2 25 08/19/2012 1131   GLUCOSE 90 08/19/2012 1131   BUN 8 08/19/2012 1131   CREATININE 1.1 08/19/2012 1131   CALCIUM 9.7 08/19/2012 1131   PROT 8.3 08/19/2012 1131   ALBUMIN 4.4 08/19/2012 1131   AST 47* 08/19/2012 1131   ALT 43 08/19/2012 1131   ALKPHOS 29* 08/19/2012 1131   BILITOT 0.7 08/19/2012 1131   Lab Results  Component Value Date   CHOL 196 08/19/2012   HDL 30.00* 08/19/2012   LDLCALC 143* 08/19/2012   TRIG 115.0 08/19/2012   CHOLHDL 7 08/19/2012    Lab Results  Component Value Date   TSH 1.00 08/19/2012  ASSESSMENT AND PLAN 53 y.o. year old male  has a past medical history of Headache(784.0). here with:  1. Migraines  Patient headaches have improved drastically. He has only being using the nortriptyline on an as-needed basis. I explained that this medication should be taken daily. For now we will discontinue the use of nortriptyline and if the patient's headache frequency increases, we can certainly restart this. In the future he may benefit from Imitrex or Maxalt. For now when he has an acute headache he will try over-the-counter medication if that is not beneficial he will let us know. The patient will followup in one year or sooner if needed.  Ward Givens, MSN, NP-C 10/11/2013, 2:01 PM [facility] [address] [place], [place] [address] [phone]  Note: This document was prepared with digital dictation and possible smart phrase technology. Any transcriptional errors that result from this process are unintentional.

## 2013-10-11 NOTE — Patient Instructions (Signed)

## 2013-10-14 ENCOUNTER — Ambulatory Visit: Payer: Managed Care, Other (non HMO) | Admitting: Nurse Practitioner

## 2013-10-22 ENCOUNTER — Encounter: Payer: Self-pay | Admitting: Family Medicine

## 2013-10-22 ENCOUNTER — Ambulatory Visit (INDEPENDENT_AMBULATORY_CARE_PROVIDER_SITE_OTHER): Payer: Managed Care, Other (non HMO) | Admitting: Family Medicine

## 2013-10-22 VITALS — BP 140/78 | HR 70 | Ht 66.0 in | Wt 214.0 lb

## 2013-10-22 DIAGNOSIS — M545 Low back pain: Secondary | ICD-10-CM

## 2013-10-22 DIAGNOSIS — G8929 Other chronic pain: Secondary | ICD-10-CM

## 2013-10-22 DIAGNOSIS — M5416 Radiculopathy, lumbar region: Secondary | ICD-10-CM

## 2013-10-22 DIAGNOSIS — M5417 Radiculopathy, lumbosacral region: Secondary | ICD-10-CM

## 2013-10-22 MED ORDER — MELOXICAM 15 MG PO TABS: 15.0000 mg | ORAL_TABLET | Freq: Every day | ORAL | Status: DC

## 2013-10-22 MED ORDER — TRAMADOL HCL 50 MG PO TABS: 50.0000 mg | ORAL_TABLET | Freq: Every evening | ORAL | Status: DC | PRN

## 2013-10-22 NOTE — Assessment & Plan Note (Signed)
Patient is having more of a lumbar radiculopathy. Patient is actually having weakness of the right leg at this time that he'll adamant that MRI is necessary. Patient was given meloxicam for an anti-inflammatory as well as tramadol for breakthrough pain. Patient was given home exercise program to start with is well. Patient will come back after the MRI and we'll discuss results greater detail. Patient's findings are consistent with either an L4 or L5 nerve root impingement which is consistent with his x-rays well. This is starting to affect his daily activities and is becoming more of a chronic pain and has weakness once again of the lower extremity.

## 2013-10-22 NOTE — Progress Notes (Signed)
  [facility] [place], [place] [address] Phone: [phone] Subjective:    I'm seeing this patient by the request  of:  Scarlette Calico, MD   CC: Low back pain  TTS:[id number] Christopher Joyce is a 53 y.o. male coming in with complaint of low back pain. Patient states that this started after he was doing his normal 3-1/2 mile walk onto the right side approximately now 6 weeks ago. Patient states that the pain started to be fairly localized in the lumbar spine demonstrates a radiate down his leg. Patient states most of it seems to be on the lateral aspect the leg and sometimes can come anteriorly. Patient states that the severity unfortunately over the course of time started to increase. Approximately 10 days after the initial injury patient did seem a another provider who did give him a prednisone Dosepak. Patient states that unfortunately that this did not make any significant improvement. Patient then came back up for followup with his regular primary care provider. 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 Patient states that it is going down his leg more frequently and is having some mild weakness. Patient states that the severity is 8/10 now. States that she is able to walk but finding it more difficult to work a regular basis secondary to pain again he did wake him up at night.     Past medical history, social, surgical and family history all reviewed in electronic medical record.   Review of Systems: No headache, visual changes, nausea, vomiting, diarrhea, constipation, dizziness, abdominal pain, skin rash, fevers, chills, night sweats, weight loss, swollen lymph nodes, body aches, joint swelling, muscle aches, chest pain, shortness of breath, mood changes.   Objective Blood pressure 140/78, pulse 70, height 5\' 6"  (1.676 m), weight 214 lb (97.07 kg), SpO2 97.00%.  General: No apparent distress alert and  oriented x3 mood and affect normal, dressed appropriately.  HEENT: Pupils equal, extraocular movements intact  Respiratory: Patient's speak in full sentences and does not appear short of breath  Cardiovascular: No lower extremity edema, non tender, no erythema  Skin: Warm dry intact with no signs of infection or rash on extremities or on axial skeleton.  Abdomen: Soft nontender  Neuro: Cranial nerves II through XII are intact, neurovascularly intact in all extremities with 2+ DTRs and 2+ pulses.  Lymph: No lymphadenopathy of posterior or anterior cervical chain or axillae bilaterally.  Gait normal with good balance and coordination.  MSK:  Non tender with full range of motion and good stability and symmetric strength and tone of shoulders, elbows, wrist, hip, knee and ankles bilaterally.  Back Exam:  Inspection: Unremarkable  Motion: Flexion 35 deg, Extension 35 deg, Side Bending to 30 deg bilaterally,  Rotation to 45 deg bilaterally  SLR laying: Negative  XSLR laying: Negative  Palpable tenderness: Severely tender over the paraspinal musculature of the lumbar spine of the right side FABER: Positive right Sensory change: Gross sensation intact to all lumbar and sacral dermatomes.  Reflexes: 2+ at both patellar tendons, 2+ at achilles tendons, Babinski's downgoing.  Strength at foot  Plantar-flexion: 5/5 Dorsi-flexion: 5/5 Eversion: 5/5 Inversion: 5/5  Leg strength  Quad: 5/5 Hamstring: 4/5 Hip flexor: 4/5 Hip abductors: 4/5  Gait is cautious but normal gait.     Impression and Recommendations:     This case required medical decision making of moderate complexity.

## 2013-10-22 NOTE — Patient Instructions (Signed)
Good to meet you Meloxicam daily for 10 days then as needed Tramadol as needed at night.  We will get MRi and come back 1-2 days after and we will discuss.  exercise Ice 20 minutes 2 times daily. Usually after activity and before bed. Vitamin D 2000 IU daily Turmeric 1-2 tsp with each meal.  Talk to you soon.  I

## 2013-11-02 ENCOUNTER — Telehealth: Payer: Self-pay | Admitting: *Deleted

## 2013-11-02 MED ORDER — MELOXICAM 15 MG PO TABS: 15.0000 mg | ORAL_TABLET | Freq: Every day | ORAL | Status: DC

## 2013-11-02 NOTE — Telephone Encounter (Signed)
Pt called stating that he never received meloxicam, re-sent rx to pharmacy.

## 2013-11-08 ENCOUNTER — Encounter: Payer: Self-pay | Admitting: Family Medicine

## 2013-11-13 ENCOUNTER — Ambulatory Visit
Admission: RE | Admit: 2013-11-13 | Discharge: 2013-11-13 | Disposition: A | Discharge status: Home / Self Care | Payer: Managed Care, Other (non HMO) | Source: Ambulatory Visit | Attending: Family Medicine | Admitting: Family Medicine

## 2013-11-13 DIAGNOSIS — M5416 Radiculopathy, lumbar region: Secondary | ICD-10-CM

## 2013-11-15 ENCOUNTER — Encounter: Payer: Self-pay | Admitting: Family Medicine

## 2013-11-16 ENCOUNTER — Other Ambulatory Visit: Payer: Self-pay | Admitting: *Deleted

## 2013-11-16 DIAGNOSIS — M5416 Radiculopathy, lumbar region: Secondary | ICD-10-CM

## 2013-11-18 ENCOUNTER — Ambulatory Visit
Admission: RE | Admit: 2013-11-18 | Discharge: 2013-11-18 | Disposition: A | Discharge status: Home / Self Care | Payer: Managed Care, Other (non HMO) | Source: Ambulatory Visit | Attending: Family Medicine | Admitting: Family Medicine

## 2013-11-18 DIAGNOSIS — M5416 Radiculopathy, lumbar region: Secondary | ICD-10-CM

## 2013-11-18 MED ORDER — METHYLPREDNISOLONE ACETATE 40 MG/ML INJ SUSP (RADIOLOG
120.0000 mg | Freq: Once | INTRAMUSCULAR | Status: AC
  Administered 2013-11-18: 120 mg via EPIDURAL

## 2013-11-18 MED ORDER — IOHEXOL 180 MG/ML  SOLN
1.0000 mL | Freq: Once | INTRAMUSCULAR | Status: AC | PRN
  Administered 2013-11-18: 1 mL via EPIDURAL

## 2013-11-18 NOTE — Discharge Instructions (Signed)

## 2013-12-03 ENCOUNTER — Other Ambulatory Visit: Payer: Self-pay | Admitting: Family Medicine

## 2013-12-03 ENCOUNTER — Other Ambulatory Visit: Payer: Self-pay | Admitting: Neurology

## 2013-12-06 ENCOUNTER — Encounter: Payer: Self-pay | Admitting: Adult Health

## 2013-12-06 MED ORDER — NORTRIPTYLINE HCL 10 MG PO CAPS: 20.0000 mg | ORAL_CAPSULE | Freq: Every day | ORAL | Status: DC

## 2013-12-06 MED ORDER — MELOXICAM 15 MG PO TABS: 15.0000 mg | ORAL_TABLET | Freq: Every day | ORAL | Status: DC

## 2013-12-06 MED ORDER — TRAMADOL HCL 50 MG PO TABS: 50.0000 mg | ORAL_TABLET | Freq: Every evening | ORAL | Status: DC | PRN

## 2013-12-06 NOTE — Telephone Encounter (Signed)
I called the patient. He states that in the last month and a half he had 4-5 headaches. He started taking his nortriptyline again however his prescription ran out. I will reorder the nortriptyline. If the patient does not find this beneficial for his headaches he should let me know. The patient was taking Percocet and Ultram for back pain. He states that he had an epidural steroid injection and that relieved his pain so therefore he is no longer using Percocet and Ultram.

## 2013-12-06 NOTE — Telephone Encounter (Signed)
Refill done.  

## 2013-12-09 MED ORDER — TRAMADOL HCL 50 MG PO TABS: 50.0000 mg | ORAL_TABLET | Freq: Every evening | ORAL | Status: DC | PRN

## 2013-12-09 NOTE — Addendum Note (Signed)
Addended by: Douglass Rivers T on: 12/09/2013 01:11 PM   Modules accepted: Orders

## 2013-12-09 NOTE — Telephone Encounter (Signed)
Tramadol called in to pharmacy, pt made aware.

## 2013-12-15 ENCOUNTER — Ambulatory Visit (INDEPENDENT_AMBULATORY_CARE_PROVIDER_SITE_OTHER): Payer: Managed Care, Other (non HMO) | Admitting: Family Medicine

## 2013-12-15 ENCOUNTER — Encounter: Payer: Self-pay | Admitting: Family Medicine

## 2013-12-15 VITALS — BP 130/82 | HR 68 | Ht 66.0 in | Wt 214.0 lb

## 2013-12-15 DIAGNOSIS — G5702 Lesion of sciatic nerve, left lower limb: Secondary | ICD-10-CM | POA: Insufficient documentation

## 2013-12-15 MED ORDER — CYCLOBENZAPRINE HCL 10 MG PO TABS: 10.0000 mg | ORAL_TABLET | Freq: Three times a day (TID) | ORAL | Status: DC | PRN

## 2013-12-15 NOTE — Assessment & Plan Note (Addendum)
Piriformis Syndrome  Using an anatomical model, reviewed with the patient the structures involved and how they related to diagnosis. The patient indicated understanding.   The patient was given a handout from Dr. Arne Cleveland book "The Sports Medicine Patient Advisor" describing the anatomy and rehabilitation of the following condition: Piriformis Syndrome  Also given a handout with more extensive Piriformis stretching, hip flexor and abductor strengthening, ham stretching  Rec deep massage, explained self-massage with ball  Patient given a muscle relaxer to try. Patient will follow-up with me again in 3-4 weeks after starting formal physical therapy.Patient continues to have difficulty we can consider ultrasound and potential injection in the piriformis muscle. Differential includes S1 nerve root impingement that was seen on the MRI but with patient improving with the epidural steroid injection I would think that this would've improved as well. We will continue to monitor.

## 2013-12-15 NOTE — Progress Notes (Signed)
[facility] [place], [place] [address] Phone: [phone] Subjective:    I'm seeing this patient by the request  of:  Scarlette Calico, MD   CC: Low back pain  TWS:[id number] Christopher Joyce is a 53 y.o. male coming in with complaint of low back pain. Last time patient was seen he was having radicular symptoms going down the right side of his leg. Patient started having unfortunately some weakness. Patient was referred for an MRI. 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 Patient did have an epidural steroid injection in this is helped out the pain significantly.  Since that time unfortunately over the course last 2 weeks he started having left buttocks pain. This is causing radiation down tohis knee. This is only on the left side in the posterior aspect and leg. Denies any weakness. States that it feels similar to the contralateral side but fortunately oes not seem to really involve the back pain as much. Patient states after sitting a long amount of time he has worsening pain. Patient also states standing for long amount time can cause pain. Movement seems to help. Denies that it is waking him up at night and has not tried any medicine except for tramadol one time which seem to help minimally. No injuries. Looking at patient's MRI of the back patient did have some mild compression of the S1 nerve root on the left side.    Past medical history, social, surgical and family history all reviewed in electronic medical record.   Review of Systems: No headache, visual changes, nausea, vomiting, diarrhea, constipation, dizziness, abdominal pain, skin rash, fevers, chills, night sweats, weight loss, swollen lymph nodes, body aches, joint swelling, muscle aches, chest pain, shortness of breath, mood changes.   Objective Blood pressure 130/82, pulse 68, height 5\' 6"  (1.676 m),  weight 214 lb (97.07 kg), SpO2 98 %.  General: No apparent distress alert and oriented x3 mood and affect normal, dressed appropriately.  HEENT: Pupils equal, extraocular movements intact  Respiratory: Patient's speak in full sentences and does not appear short of breath  Cardiovascular: No lower extremity edema, non tender, no erythema  Skin: Warm dry intact with no signs of infection or rash on extremities or on axial skeleton.  Abdomen: Soft nontender  Neuro: Cranial nerves II through XII are intact, neurovascularly intact in all extremities with 2+ DTRs and 2+ pulses.  Lymph: No lymphadenopathy of posterior or anterior cervical chain or axillae bilaterally.  Gait normal with good balance and coordination.  MSK:  Non tender with full range of motion and good stability and symmetric strength and tone of shoulders, elbows, wrist, hip, knee and ankles bilaterally.  Back Exam:  Inspection: Unremarkable  Motion: Flexion 35 deg, Extension 35 deg, Side Bending to 30 deg bilaterally,  Rotation to 45 deg bilaterally  SLR laying: Negative  XSLR laying: Negative  Palpable tenderness: tender over the left piriformis FABER: positive left, this is different than previous exam Sensory change: Gross sensation intact to all lumbar and sacral dermatomes.  Reflexes: 2+ at both patellar tendons, 2+ at achilles tendons, Babinski's downgoing.  Strength at foot  Plantar-flexion: 5/5 Dorsi-flexion: 5/5 Eversion: 5/5 Inversion: 5/5  Leg strength  Quad: 5/5 Hamstring: 4/5 Hip flexor: 4/5 Hip abductors: 4/5  Gait is normal     Impression and Recommendations:     This case required medical decision making of moderate  complexity.

## 2013-12-15 NOTE — Patient Instructions (Addendum)
We will get you there Physical therapy will be calling you.  Ice is your friend Tennisball in back left pocket.  Exercises on wall.  Heel and butt touching.  Raise leg 6 inches and hold 2 seconds.  Down slow for count of 4 seconds.  1 set of 30 reps daily on both sides.  Exercises 3 times a week in handout.  See me again in 3 weeks.

## 2013-12-22 ENCOUNTER — Ambulatory Visit: Payer: Managed Care, Other (non HMO) | Attending: Family Medicine

## 2013-12-22 DIAGNOSIS — M25552 Pain in left hip: Secondary | ICD-10-CM | POA: Diagnosis not present

## 2013-12-22 DIAGNOSIS — M25569 Pain in unspecified knee: Secondary | ICD-10-CM | POA: Insufficient documentation

## 2013-12-22 DIAGNOSIS — M25659 Stiffness of unspecified hip, not elsewhere classified: Secondary | ICD-10-CM

## 2013-12-22 NOTE — Therapy (Addendum)
[facility] [address] [place], [place], [address] Phone: [phone]   Fax:  [phone]  Physical Therapy Evaluation  Patient Details  Name: Christopher Joyce MRN: [id number] Date of Birth: 08-21-1960  Encounter Date: 12/22/2013      PT End of Session - 12/22/13 1151    Visit Number 1   Number of Visits 16   Date for PT Re-Evaluation 02/22/14   PT Start Time 0930   PT Stop Time 1015   PT Time Calculation (min) 45 min   Activity Tolerance Patient tolerated treatment well   Behavior During Therapy [facility] for tasks assessed/performed      Past Medical History  Diagnosis Date  . MMCRFVOH(606.7)     Past Surgical History  Procedure Laterality Date  . None      There were no vitals taken for this visit.  Visit Diagnosis:  Stiffness of hip joint, unspecified laterality - Plan: PT plan of care cert/re-cert  Pain in joint, pelvic region and thigh, left - Plan: PT plan of care cert/re-cert      Subjective Assessment - 12/22/13 0938    Symptoms PAin in back and Lt hip and thigh.    Pertinent History 4-5 mionths ago He began to have pain in back and Rt leg without injury but came on suddeny. He had epidural injection. He began to have Lt hip thigh pain started 3 weeks ago. He uses tennis ball in Lt hip    Limitations Walking;Standing  AM pain in Lt hip worst. Feel most in evening   How long can you sit comfortably? As needed   How long can you stand comfortably? 30-60 min   How long can you walk comfortably? 10 minutes   Diagnostic tests xray:  negative     MRI HNP x3 causing pain in RT leg.    Patient Stated Goals walk/ stand  without pain   Currently in Pain? Yes   Pain Score 2    Pain Location Hip   Pain Orientation Left   Pain Descriptors / Indicators Aching   Pain Type Chronic pain   Pain Onset More than a month ago   Pain Frequency Intermittent   Aggravating Factors  Standing and walking   Pain Relieving Factors sitting,  medication (makes sleepy)   Multiple Pain Sites No          [facility] PT Assessment - 12/22/13 0945    Assessment   Medical Diagnosis LT piriformis syndrome   Onset Date --  4-5 moinths ago   Precautions   Precautions None   Restrictions   Weight Bearing Restrictions No   Balance Screen   Has the patient fallen in the past 6 months No   Has the patient had a decrease in activity level because of a fear of falling?  No   Prior Function   Level of Independence --  No pain prior to 5 months ago   Vocation Full time Doctor, hospital   Posture/Postural Control Postural limitations   Postural Limitations Forward head;Rounded Shoulders;Decreased lumbar lordosis  RT shoulder higher than Lt , RT ilia crest higher   AROM   Left Hip Extension 10   Left Hip Flexion 110   Left Hip External Rotation  30   Left Hip Internal Rotation  5   Left Hip ABduction 40   Left Hip ADduction 20   Lumbar Flexion Normal   Lumbar Extension decreased 1/3   Lumbar -  Right Side Bend decr 2/3   Lumbar - Left Side Bend decr 2/3   Strength   Overall Strength Within functional limits for tasks performed   Ambulation/Gait   Ambulation/Gait --  WNL today              PT Short Term Goals - 12/22/13 1154    PT SHORT TERM GOAL #1   Title He will be independent with intial HEP   Time 4   Period Weeks   Status New   PT SHORT TERM GOAL #2   Title He will report pain decr 40% or more in RT hip and thigh   Time 4   Period Weeks   Status New   PT SHORT TERM GOAL #3   Title He will improve LT hip range to allow for decr pain    Time 4   Period Weeks   Status New          PT Long Term Goals - 12/22/13 1155    PT LONG TERM GOAL #1   Title independent with HEP issued as of last visit   Time 8   Period Weeks   Status New   PT LONG TERM GOAL #2   Title He will report pain decr 75% or mroe with normal work and home tasks   Time 8   Period Weeks   Status New    PT LONG TERM GOAL #3   Title Report able to be on feet for >60 minutes without inc. pain   Time 8   Period Weeks   Status New          Plan - 12/22/13 1152    Clinical Impression Statement Mr Sanchez is very stiff in Both hips. He will benefit form stretching and mobs, STW, modalities PRN   Pt will benefit from skilled therapeutic intervention in order to improve on the following deficits Impaired flexibility;Pain;Increased muscle spasms;Decreased activity tolerance;Decreased range of motion   Rehab Potential Good   PT Frequency 2x / week   PT Duration 8 weeks   PT Treatment/Interventions Moist Heat;Ultrasound;Electrical Stimulation;Therapeutic activities;Therapeutic exercise;Manual techniques;Dry needling;Passive range of motion;Patient/family education   PT Next Visit Plan Hip mobs and stretching, modalities as needed,    PT Home Exercise Plan Stretching exercises   Recommended Other Services None   Consulted and Agree with Plan of Care Patient                              Problem List Patient Active Problem List   Diagnosis Date Noted  . Piriformis syndrome of left side 12/15/2013  . Right lumbar radiculitis 10/04/2013  . Elevated blood pressure reading without diagnosis of hypertension 01/22/2013  . Abnormal MRI of head 08/25/2012  . Headache(784.0) 08/19/2012  . Hyperlipidemia LDL goal < 130 08/19/2012    Darrel Hoover PT 12/22/2013, 11:58 AM     PHYSICAL THERAPY DISCHARGE SUMMARY  Visits from Start of Care: 12  Current functional level related to goals / functional outcomes: See Above   Remaining deficits: He continued to have mild pain but was much improved. He did not come to last scheduled visit   Education / Equipment: HEP  Plan: Patient agrees to discharge.  Patient goals were not met. Patient is being discharged due to meeting the stated rehab goals.  ?????   Darrel Hoover, PT           03/25/14  8:31  AM

## 2013-12-22 NOTE — Patient Instructions (Addendum)
Knee to Chest (Flexion)   Pull knee toward chest. Feel stretch in lower back or buttock area. Breathing deeply, Hold ____ seconds. Repeat with other knee. Repeat ____ times. Do ____ sessions per day.  http://gt2.exer.us/225   Copyright  VHI. All rights reserved.  Hip Abduction / Adduction: with Knee Flexion (Supine)   With right knee bent, gently lower knee to side and return. Repeat ____ times per set. Do ____ sets per session. Do ____ sessions per day.  http://orth.exer.us/682   Copyright  VHI. All rights reserved.  Lye on RT side with RT knee toward chest and LT high back in line with body    15-30 sec stretching.   Can do both sides

## 2013-12-28 ENCOUNTER — Ambulatory Visit: Payer: Managed Care, Other (non HMO)

## 2013-12-28 DIAGNOSIS — M25552 Pain in left hip: Secondary | ICD-10-CM

## 2013-12-28 DIAGNOSIS — M25659 Stiffness of unspecified hip, not elsewhere classified: Secondary | ICD-10-CM

## 2013-12-28 DIAGNOSIS — M25569 Pain in unspecified knee: Secondary | ICD-10-CM | POA: Diagnosis not present

## 2013-12-28 NOTE — Therapy (Signed)
[place], [place], [address] Phone: [phone]   Fax:  [phone]  Physical Therapy Treatment  Patient Details  Name: Christopher Joyce MRN: [id number] Date of Birth: 13-Dec-1960  Encounter Date: 12/28/2013      PT End of Session - 12/28/13 1637    Visit Number 2   Number of Visits 16   Date for PT Re-Evaluation 02/22/14   PT Start Time 6195   PT Stop Time 1630   PT Time Calculation (min) 45 min   Activity Tolerance Patient tolerated treatment well   Behavior During Therapy [facility] for tasks assessed/performed      Past Medical History  Diagnosis Date  . KDTOIZTI(458.0)     Past Surgical History  Procedure Laterality Date  . None      There were no vitals taken for this visit.  Visit Diagnosis:  Stiffness of hip joint, unspecified laterality  Pain in joint, pelvic region and thigh, left      Subjective Assessment - 12/28/13 1544    Symptoms Min pain now  Most in AM   Currently in Pain? No/denies   Multiple Pain Sites No                    [facility] Adult PT Treatment/Exercise - 12/28/13 1550    Lumbar Exercises: Stretches   Hip Flexor Stretch 60 seconds  RT an LT step stand foot on table    Prone on Elbows Stretch 60 seconds   Lumbar Exercises: Aerobic   Stationary Bike Nustep L5 UE and LE   Lumbar Exercises: Standing   Forward Lunge --  stand foot on table hip DF stretch   Other Standing Lumbar Exercises Trunk rotation in step standing    Lumbar Exercises: Sidelying   Other Sidelying Lumbar Exercises Bretzell stretch wih RT  top leg forward and top leg back.  30 sec   Lumbar Exercises: Prone   Other Prone Lumbar Exercises prone on elbows 1 min   Lumbar Exercises: Quadruped   [place]/Old Horse 10 reps  tactile cues for pelvic movement   Manual Therapy   Manual Therapy Passive ROM                PT Education - 12/28/13 1636    Education provided Yes    Education Details HEP streching   Person(s) Educated Patient   Methods Explanation;Demonstration;Tactile cues;Verbal cues   Comprehension Verbalized understanding;Returned demonstration          PT Short Term Goals - 12/22/13 1154    PT SHORT TERM GOAL #1   Title He will be independent with intial HEP   Time 4   Period Weeks   Status New   PT SHORT TERM GOAL #2   Title He will report pain decr 40% or more in RT hip and thigh   Time 4   Period Weeks   Status New   PT SHORT TERM GOAL #3   Title He will improve LT hip range to allow for decr pain    Time 4   Period Weeks   Status New           PT Long Term Goals - 12/22/13 1155    PT LONG TERM GOAL #1   Title independent with HEP issued as of last visit   Time 8   Period Weeks   Status New   PT LONG TERM GOAL #2   Title He will report pain decr  75% or mroe with normal work and home tasks   Time 8   Period Weeks   Status New   PT LONG TERM GOAL #3   Title Report able to be on feet for >60 minutes without inc. pain   Time 8   Period Weeks   Status New               Plan - 12/28/13 1638    Clinical Impression Statement No pain today and he performed exercise correctly   PT Treatment/Interventions Moist Heat;Ultrasound;Electrical Stimulation;Therapeutic activities;Therapeutic exercise;Manual techniques;Dry needling;Passive range of motion;Patient/family education   PT Next Visit Plan Hip mobs and stretching, modalities as needed,    PT Home Exercise Plan Stretching exercises   Consulted and Agree with Plan of Care Patient        Problem List Patient Active Problem List   Diagnosis Date Noted  . Piriformis syndrome of left side 12/15/2013  . Right lumbar radiculitis 10/04/2013  . Elevated blood pressure reading without diagnosis of hypertension 01/22/2013  . Abnormal MRI of head 08/25/2012  . Headache(784.0) 08/19/2012  . Hyperlipidemia LDL goal < 130 08/19/2012    Darrel Hoover  PT 12/28/2013, 4:39 PM  [place] [facility] [address] [place], [place], [address] Phone: [phone]   Fax:  [phone]

## 2013-12-28 NOTE — Patient Instructions (Signed)
Bretzell stretching, ITB stretch, hip flexor stretching , ankle DF stretching , trunk/hip  rotation stretching  2x/day with 30-60 sec stretching,  2-3 reps RT and LT

## 2013-12-29 ENCOUNTER — Ambulatory Visit: Payer: Managed Care, Other (non HMO) | Admitting: Rehabilitation

## 2013-12-29 DIAGNOSIS — M25659 Stiffness of unspecified hip, not elsewhere classified: Secondary | ICD-10-CM

## 2013-12-29 DIAGNOSIS — M25569 Pain in unspecified knee: Secondary | ICD-10-CM | POA: Diagnosis not present

## 2013-12-29 DIAGNOSIS — M25552 Pain in left hip: Secondary | ICD-10-CM

## 2013-12-29 NOTE — Patient Instructions (Signed)
Hamstring Stretch   With other leg bent, foot flat, grasp right leg and slowly try to straighten knee. Hold __30__ seconds. Repeat __3__ times. Do ___30_ sessions per day.  http://gt2.exer.us/279   Copyright  VHI. All rights reserved.  Hip Stretch   Put right ankle over left knee. Let right knee fall downward, but keep ankle in place. Feel the stretch in hip. May push down gently with hand to feel stretch. Hold __30__ seconds while counting out loud. Repeat with other leg. Repeat __2__ times. Do ____ sessions per day.  http://gt2.exer.us/497   Copyright  VHI. All rights reserved.

## 2013-12-29 NOTE — Therapy (Signed)
[place], [place], [address] Phone: [phone]   Fax:  [phone]  Physical Therapy Treatment  Patient Details  Name: Christopher Joyce MRN: [id number] Date of Birth: 1960/06/28  Encounter Date: 12/29/2013      PT End of Session - 12/29/13 1149    Visit Number 3   Number of Visits 16   Date for PT Re-Evaluation 02/22/14   PT Start Time 1100   PT Stop Time 1145   PT Time Calculation (min) 45 min      Past Medical History  Diagnosis Date  . JFHLKTGY(563.8)     Past Surgical History  Procedure Laterality Date  . None      There were no vitals taken for this visit.  Visit Diagnosis:  No diagnosis found.      Subjective Assessment - 12/29/13 1103    Symptoms Pain 50% deccreased this morning in hip   Currently in Pain? No/denies   Aggravating Factors  prolonged walking   Pain Relieving Factors sitting   Multiple Pain Sites No          [facility] PT Assessment - 12/29/13 1109    AROM   Left Hip Extension 8   Left Hip Flexion 120   Left Hip External Rotation  40   Left Hip Internal Rotation  28   Lumbar Flexion 80   Lumbar Extension 15   Lumbar - Right Side Bend 28   Lumbar - Left Side Bend 32                  [facility] Adult PT Treatment/Exercise - 12/29/13 1123    Lumbar Exercises: Stretches   Active Hamstring Stretch 3 reps;30 seconds   Hip Flexor Stretch 60 seconds;2 reps  bilateral   Piriformis Stretch 3 reps;30 seconds   Lumbar Exercises: Sidelying   Other Sidelying Lumbar Exercises Bretzell stretch wih RT  top leg forward and top leg back.  30 sec  60 sec x 2   Other Sidelying Lumbar Exercises lunge stretch   60 sec x 2                PT Education - 12/29/13 1150    Education provided Yes   Education Details HEP   Person(s) Educated Patient   Methods Explanation;Handout   Comprehension Verbalized understanding          PT Short Term Goals -  12/29/13 1107    PT SHORT TERM GOAL #1   Title He will be independent with intial HEP   Time 4   Period Weeks   Status Achieved   PT SHORT TERM GOAL #2   Title He will report pain decr 40% or more in RT hip and thigh   Time 4   Period Weeks   Status Achieved   PT SHORT TERM GOAL #3   Title He will improve LT hip range to allow for decr pain    Time 4   Period Weeks           PT Long Term Goals - 12/29/13 1108    PT LONG TERM GOAL #1   Title independent with HEP issued as of last visit   Time 8   Period Weeks   Status On-going   PT LONG TERM GOAL #2   Title He will report pain decr 75% or mroe with normal work and home tasks   Time 8   Period Weeks   Status On-going  PT LONG TERM GOAL #3   Title Report able to be on feet for >60 minutes without inc. pain   Time 8   Period Weeks   Status On-going               Plan - 12/29/13 1105    Clinical Impression Statement Pt reports improved walking tolerance noted with walking .75 miles this morning in parking lot to go to a meeting. Overall >/= 40% decrease in pain to date. ROM improved. STG# 1,2 MET   PT Next Visit Plan Hip mobs and stretching, modalities as needed,         Problem List Patient Active Problem List   Diagnosis Date Noted  . Piriformis syndrome of left side 12/15/2013  . Right lumbar radiculitis 10/04/2013  . Elevated blood pressure reading without diagnosis of hypertension 01/22/2013  . Abnormal MRI of head 08/25/2012  . Headache(784.0) 08/19/2012  . Hyperlipidemia LDL goal < 130 08/19/2012    Dorene Ar, [place] 12/29/2013, 11:50 AM  [facility] [address] [place], [place], [address] Phone: [phone]   Fax:  [phone]

## 2014-01-06 ENCOUNTER — Ambulatory Visit: Payer: Managed Care, Other (non HMO)

## 2014-01-06 DIAGNOSIS — M25569 Pain in unspecified knee: Secondary | ICD-10-CM | POA: Diagnosis not present

## 2014-01-06 DIAGNOSIS — M25652 Stiffness of left hip, not elsewhere classified: Secondary | ICD-10-CM

## 2014-01-06 DIAGNOSIS — M25552 Pain in left hip: Secondary | ICD-10-CM

## 2014-01-06 DIAGNOSIS — M2560 Stiffness of unspecified joint, not elsewhere classified: Secondary | ICD-10-CM

## 2014-01-06 NOTE — Therapy (Signed)
[place], [place], [address] Phone: [phone]   Fax:  [phone]  Physical Therapy Treatment  Patient Details  Name: Christopher Joyce MRN: [id number] Date of Birth: Jun 01, 1960  Encounter Date: 01/06/2014      PT End of Session - 01/06/14 1306    PT Stop Time 1300   Activity Tolerance Patient tolerated treatment well   Behavior During Therapy [facility] for tasks assessed/performed      Past Medical History  Diagnosis Date  . ONGEXBMW(413.2)     Past Surgical History  Procedure Laterality Date  . None      There were no vitals taken for this visit.  Visit Diagnosis:  Stiffness of left hip joint  Joint stiffness of spine  Pain, hip, left      Subjective Assessment - 01/06/14 1229    Symptoms Worse since been coming. Startted with walking in mall 25 min yeasterday. Pain releived in sitting   Limitations Walking;Standing   How long can you stand comfortably? 30-60 min   How long can you walk comfortably? 10 minutes   Multiple Pain Sites No                    [facility] Adult PT Treatment/Exercise - 01/06/14 1224    Lumbar Exercises: Stretches   Prone on Elbows Stretch 1 rep;30 seconds   Press Ups 10 seconds;5 reps   Lumbar Exercises: Aerobic   Stationary Bike --  nustep L5 UE and LE   Manual Therapy   Manual Therapy Joint mobilization;Other (comment);Myofascial release   Joint Mobilization PA glides Gr 3-4 SI jint and L5 through T12   Myofascial Release hip rotators with presure and hip rotation   Other Manual Therapy MET for posterior RT ilia and L on L sacrum. . After MET pressup was more mobile and no pain with press up/                 PT Education - 01/06/14 1305    Education provided Yes   Education Details Pressups , educated with model of pelvis to describe MET and effects on boney position    Person(s) Educated Patient   Methods  Explanation;Demonstration;Verbal cues;Handout   Comprehension Verbalized understanding;Returned demonstration          PT Short Term Goals - 12/29/13 1107    PT SHORT TERM GOAL #1   Title He will be independent with intial HEP   Time 4   Period Weeks   Status Achieved   PT SHORT TERM GOAL #2   Title He will report pain decr 40% or more in RT hip and thigh   Time 4   Period Weeks   Status Achieved   PT SHORT TERM GOAL #3   Title He will improve LT hip range to allow for decr pain    Time 4   Period Weeks           PT Long Term Goals - 12/29/13 1108    PT LONG TERM GOAL #1   Title independent with HEP issued as of last visit   Time 8   Period Weeks   Status On-going   PT LONG TERM GOAL #2   Title He will report pain decr 75% or mroe with normal work and home tasks   Time 8   Period Weeks   Status On-going   PT LONG TERM GOAL #3   Title Report able to be on feet  for >60 minutes without inc. pain   Time 8   Period Weeks   Status On-going               Plan - 01/06/14 1307    Clinical Impression Statement Possible positional issue causing pain but will assess next visit. Improved pain and spine range post treatment   Pt will benefit from skilled therapeutic intervention in order to improve on the following deficits Impaired flexibility;Pain;Increased muscle spasms;Decreased activity tolerance;Decreased range of motion   Rehab Potential Good   PT Frequency 2x / week   PT Duration 6 weeks   PT Treatment/Interventions Moist Heat;Ultrasound;Electrical Stimulation;Therapeutic activities;Therapeutic exercise;Manual techniques;Dry needling;Passive range of motion;Patient/family education   PT Next Visit Plan Hip mobs and stretching, modalities as needed, MET if needed   PT Home Exercise Plan Stretching exercises   Consulted and Agree with Plan of Care Patient        Problem List Patient Active Problem List   Diagnosis Date Noted  . Piriformis syndrome of  left side 12/15/2013  . Right lumbar radiculitis 10/04/2013  . Elevated blood pressure reading without diagnosis of hypertension 01/22/2013  . Abnormal MRI of head 08/25/2012  . Headache(784.0) 08/19/2012  . Hyperlipidemia LDL goal < 130 08/19/2012    Darrel Hoover PT 01/06/2014, 1:11 PM  [facility] [address] [place], [place], [address] Phone: [phone]   Fax:  [phone]

## 2014-01-06 NOTE — Patient Instructions (Signed)
Elbow Prop (Extension)   Prop body up on elbows for ____ seconds. Slowly lower it. Repeat ____ times. Do ____ sessions per day.  http://gt2.exer.us/243   Copyright  VHI. All rights reserved.  Back Hyperextension: Using Arms   Lying face down with arms bent, inhale. Then while exhaling, straighten arms. Hold ____ seconds. Slowly return to starting position. Repeat ____ times per set. Do ____ sets per session. Do ____ sessions per day. Extension   Lie face down, hands close to chest. Press trunk up, arching back. Keep neck long, shoulders down. Tighten buttocks to protect lower back. Hold ____ seconds. Repeat ____ times. Do ____ sessions per day.  Copyright  VHI. All rights reserved.   Copyright  VHI. All rights reserved.   Copyright  VHI. All rights reserved.  Backward Bend (Standing) et. Do ____ sets per session. Do ____ sessions per day.  http://orth.exer.us/178   Copyright  VHI. All rights reserved.  Asked pt and demo pressup with lateral shift RT or LT which ever eases pain , incr range

## 2014-01-10 ENCOUNTER — Ambulatory Visit: Payer: Managed Care, Other (non HMO) | Attending: Family Medicine

## 2014-01-10 DIAGNOSIS — M25552 Pain in left hip: Secondary | ICD-10-CM | POA: Diagnosis not present

## 2014-01-10 DIAGNOSIS — M25659 Stiffness of unspecified hip, not elsewhere classified: Secondary | ICD-10-CM | POA: Diagnosis present

## 2014-01-10 DIAGNOSIS — M25569 Pain in unspecified knee: Secondary | ICD-10-CM | POA: Diagnosis not present

## 2014-01-10 DIAGNOSIS — M2560 Stiffness of unspecified joint, not elsewhere classified: Secondary | ICD-10-CM

## 2014-01-10 DIAGNOSIS — M25652 Stiffness of left hip, not elsewhere classified: Secondary | ICD-10-CM

## 2014-01-10 NOTE — Therapy (Signed)
[place], [place], [address] Phone: [phone]   Fax:  [phone]  Physical Therapy Treatment  Patient Details  Name: Christopher Joyce MRN: [id number] Date of Birth: 1960/06/24  Encounter Date: 01/10/2014      PT End of Session - 01/10/14 1622    Visit Number 5   Number of Visits 16   Date for PT Re-Evaluation 02/22/14   PT Start Time 1540   PT Stop Time 1610   PT Time Calculation (min) 30 min   Activity Tolerance Patient tolerated treatment well   Behavior During Therapy [facility] for tasks assessed/performed      Past Medical History  Diagnosis Date  . UQJFHLKT(625.6)     Past Surgical History  Procedure Laterality Date  . None      There were no vitals taken for this visit.  Visit Diagnosis:  Stiffness of left hip joint  Joint stiffness of spine  Pain, hip, left  Stiffness of hip joint, unspecified laterality  Pain in joint, pelvic region and thigh, left      Subjective Assessment - 01/10/14 1543    Symptoms Bette than when I started   Currently in Pain? Yes   Pain Score 2    Pain Location Hip   Pain Orientation Left   Pain Descriptors / Indicators Aching   Pain Type Chronic pain   Pain Onset More than a month ago   Pain Frequency Constant   Aggravating Factors  prolonged walking   Multiple Pain Sites No                    [facility] Adult PT Treatment/Exercise - 01/10/14 1544    Lumbar Exercises: Stretches   Piriformis Stretch 2 reps;30 seconds   Lumbar Exercises: Aerobic   Stationary Bike Nustep Level 6 x 8 min   Lumbar Exercises: Supine   Clam 15 reps;3 seconds  isometric with bridge into strap   Bridge Limitations Short arc with ball squeeze x15   Manual Therapy   Manual Therapy Joint mobilization;Other (comment);Myofascial release   Joint Mobilization Gr 4 SI and lumbar spine   Myofascial Release Hip LT rotation with soft tissue pressure    Other Manual  Therapy MAnual mobd to LT hip joint with rotation                 PT Education - 01/10/14 1621    Education provided Yes   Education Details pelvis stab exercises   Person(s) Educated Patient   Methods Explanation;Demonstration;Verbal cues;Handout   Comprehension Verbalized understanding;Returned demonstration          PT Short Term Goals - 12/29/13 1107    PT SHORT TERM GOAL #1   Title He will be independent with intial HEP   Time 4   Period Weeks   Status Achieved   PT SHORT TERM GOAL #2   Title He will report pain decr 40% or more in RT hip and thigh   Time 4   Period Weeks   Status Achieved   PT SHORT TERM GOAL #3   Title He will improve LT hip range to allow for decr pain    Time 4   Period Weeks           PT Long Term Goals - 12/29/13 1108    PT LONG TERM GOAL #1   Title independent with HEP issued as of last visit   Time 8   Period Weeks  Status On-going   PT LONG TERM GOAL #2   Title He will report pain decr 75% or mroe with normal work and home tasks   Time 8   Period Weeks   Status On-going   PT LONG TERM GOAL #3   Title Report able to be on feet for >60 minutes without inc. pain   Time 8   Period Weeks   Status On-going               Plan - 01/10/14 1622    Clinical Impression Statement No pain after session.    Rehab Potential Good   PT Duration 2 weeks   PT Next Visit Plan Hip mobs and stretching, modalities as needed, MET if needed   PT Home Exercise Plan stab exercises   Consulted and Agree with Plan of Care Patient        Problem List Patient Active Problem List   Diagnosis Date Noted  . Piriformis syndrome of left side 12/15/2013  . Right lumbar radiculitis 10/04/2013  . Elevated blood pressure reading without diagnosis of hypertension 01/22/2013  . Abnormal MRI of head 08/25/2012  . Headache(784.0) 08/19/2012  . Hyperlipidemia LDL goal < 130 08/19/2012    Darrel Hoover PT 01/10/2014, 4:26 PM  [facility] [address] [place], [place], [address] Phone: [phone]   Fax:  [phone]

## 2014-01-10 NOTE — Patient Instructions (Signed)
Instructed and issued a HEP with hip abduct and adduct isometrics with bridge x15 3 sec hold

## 2014-01-18 ENCOUNTER — Ambulatory Visit: Payer: Managed Care, Other (non HMO)

## 2014-01-18 DIAGNOSIS — M2560 Stiffness of unspecified joint, not elsewhere classified: Secondary | ICD-10-CM

## 2014-01-18 DIAGNOSIS — M25569 Pain in unspecified knee: Secondary | ICD-10-CM | POA: Diagnosis not present

## 2014-01-18 DIAGNOSIS — M25659 Stiffness of unspecified hip, not elsewhere classified: Secondary | ICD-10-CM

## 2014-01-18 DIAGNOSIS — M25552 Pain in left hip: Secondary | ICD-10-CM

## 2014-01-18 DIAGNOSIS — M25652 Stiffness of left hip, not elsewhere classified: Secondary | ICD-10-CM

## 2014-01-18 NOTE — Therapy (Signed)
[place], [place], [address] Phone: [phone]   Fax:  [phone]  Physical Therapy Treatment  Patient Details  Name: Christopher Joyce MRN: [id number] Date of Birth: [birth year] Referring Provider:  Janith Lima, MD  Encounter Date: 01/18/2014      PT End of Session - 01/18/14 1126    Visit Number 6   Number of Visits 16   Date for PT Re-Evaluation 02/22/14   PT Start Time 1050   PT Stop Time 1140   PT Time Calculation (min) 50 min   Activity Tolerance Patient tolerated treatment well   Behavior During Therapy [facility] for tasks assessed/performed      Past Medical History  Diagnosis Date  . [id number](779.3)     Past Surgical History  Procedure Laterality Date  . None      There were no vitals taken for this visit.  Visit Diagnosis:  Stiffness of left hip joint  Joint stiffness of spine  Pain, hip, left  Stiffness of hip joint, unspecified laterality  Pain in joint, pelvic region and thigh, left      Subjective Assessment - 01/18/14 1101    Symptoms When I think its better my hip seems to be worse. Beginning to get numb feeling into LT anterior knee to ankle. 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    Currently in Pain? Yes   Pain Score 4    Pain Location Hip   Pain Orientation Left   Pain Descriptors / Indicators Aching   Pain Type Chronic pain   Pain Radiating Towards LT lower leg with standing 10 minutes   Pain Onset More than a month ago   Pain Frequency Constant  numb feeling intermittant with standing   Aggravating Factors  Standing and walking   Pain Relieving Factors sit   Multiple Pain Sites No                    [facility] Adult PT Treatment/Exercise - 01/18/14 1106    Lumbar Exercises: Aerobic   Stationary Bike Nustep Level 6 x 8 min   Modalities   Modalities Traction   Traction   Type of Traction Lumbar   Min (lbs) 35   Max (lbs) 75   Hold Time 60   Rest Time 15   Time 10   Manual Therapy   Manual Therapy Joint mobilization   Joint Mobilization Gr 3-4 Hip rotation with soft tissue mobs and PA pressures to RT and LT lower lumbar spine and piriformis   Myofascial Release hip rotators with soft tisuse pressure to gluteals /piriformis                  PT Short Term Goals - 01/18/14 1130    PT SHORT TERM GOAL #1   Title He will be independent with intial HEP   Status Achieved   PT SHORT TERM GOAL #2   Title He will report pain decr 40% or more in RT hip and thigh   Status Achieved  but appears to be getting worse   PT SHORT TERM GOAL #3   Title He will improve LT hip range to allow for decr pain    Status On-going           PT Long Term Goals - 01/18/14 1131    PT LONG TERM GOAL #  1   Title independent with HEP issued as of last visit   Status On-going   PT LONG TERM GOAL #2   Title He will report pain decr 75% or mroe with normal work and home tasks   Status On-going   PT [place] #3   Title Report able to be on feet for >60 minutes without inc. pain  Only able to stand 10 minutes   Status On-going               Plan - 01/18/14 1127    Clinical Impression Statement All pelvic sites appear level.   Palpation only shows minor tenderness tat is not consistent at LT trochantor and pressure around trochantor gave some LT lower back pain.  No pain with pressure to lower back.   He may need to have hip assessed and may need repeat injection for numbness   Pt will benefit from skilled therapeutic intervention in order to improve on the following deficits Impaired flexibility;Pain;Increased muscle spasms;Decreased activity tolerance;Decreased range of motion   Rehab Potential Good   PT Frequency 2x / week   PT Duration 2 weeks   PT Treatment/Interventions Moist Heat;Ultrasound;Electrical Stimulation;Therapeutic  activities;Therapeutic exercise;Manual techniques;Dry needling;Passive range of motion;Patient/family education   PT Next Visit Plan Hip mobs and stretching, modalities as needed, MET if needed, Traction if no irritation and or some benefit   PT Home Exercise Plan stab exercises,    Consulted and Agree with Plan of Care Patient        Problem List Patient Active Problem List   Diagnosis Date Noted  . Piriformis syndrome of left side 12/15/2013  . Right lumbar radiculitis 10/04/2013  . Elevated blood pressure reading without diagnosis of hypertension 01/22/2013  . Abnormal MRI of head 08/25/2012  . Headache(784.0) 08/19/2012  . Hyperlipidemia LDL goal < 130 08/19/2012    Darrel Hoover PT 01/18/2014, 11:33 AM  [facility] [address] [place], [place], [address] Phone: [phone]   Fax:  [phone]

## 2014-01-20 ENCOUNTER — Ambulatory Visit: Payer: Managed Care, Other (non HMO)

## 2014-01-20 DIAGNOSIS — M25569 Pain in unspecified knee: Secondary | ICD-10-CM | POA: Diagnosis not present

## 2014-01-20 DIAGNOSIS — R202 Paresthesia of skin: Secondary | ICD-10-CM

## 2014-01-20 DIAGNOSIS — R200 Anesthesia of skin: Secondary | ICD-10-CM

## 2014-01-20 DIAGNOSIS — M25659 Stiffness of unspecified hip, not elsewhere classified: Secondary | ICD-10-CM

## 2014-01-20 DIAGNOSIS — M25652 Stiffness of left hip, not elsewhere classified: Secondary | ICD-10-CM

## 2014-01-20 DIAGNOSIS — M2560 Stiffness of unspecified joint, not elsewhere classified: Secondary | ICD-10-CM

## 2014-01-20 DIAGNOSIS — M25552 Pain in left hip: Secondary | ICD-10-CM

## 2014-01-20 NOTE — Therapy (Signed)
[place], [place], [address] Phone: [phone]   Fax:  [phone]  Physical Therapy Treatment  Patient Details  Name: Christopher Joyce MRN: [id number] Date of Birth: December 22, 1960 Referring Provider:  Janith Lima, MD  Encounter Date: 01/20/2014      PT End of Session - 01/20/14 0954    Visit Number 7   Number of Visits 16   Date for PT Re-Evaluation 02/22/14   PT Start Time 0923   PT Stop Time 1005   PT Time Calculation (min) 42 min   Activity Tolerance Patient tolerated treatment well   Behavior During Therapy [facility] for tasks assessed/performed      Past Medical History  Diagnosis Date  . GEXBMWUX(324.4)     Past Surgical History  Procedure Laterality Date  . None      There were no vitals taken for this visit.  Visit Diagnosis:  Stiffness of left hip joint  Joint stiffness of spine  Pain, hip, left  Stiffness of hip joint, unspecified laterality  Pain in joint, pelvic region and thigh, left  Numbness and tingling of left leg      Subjective Assessment - 01/20/14 0952    Symptoms Felt better with less tinglin after last visit   Currently in Pain? No/denies  He has minmal aprasthesisas into his LT lower leg                    [facility] Adult PT Treatment/Exercise - 01/20/14 0953    Lumbar Exercises: Aerobic   Stationary Bike Nustep Level 6 x 8 min   Traction   Type of Traction Lumbar   Min (lbs) 35   Max (lbs) 80   Hold Time 60   Rest Time 15   Time 12                  PT Short Term Goals - 01/18/14 1130    PT SHORT TERM GOAL #1   Title He will be independent with intial HEP   Status Achieved   PT SHORT TERM GOAL #2   Title He will report pain decr 40% or more in RT hip and thigh   Status Achieved  but appears to be getting worse   PT SHORT TERM GOAL #3   Title He will improve LT hip range to allow for decr pain    Status On-going            PT Long Term Goals - 01/18/14 1131    PT LONG TERM GOAL #1   Title independent with HEP issued as of last visit   Status On-going   PT LONG TERM GOAL #2   Title He will report pain decr 75% or mroe with normal work and home tasks   Status On-going   PT [place] #3   Title Report able to be on feet for >60 minutes without inc. pain  Only able to stand 10 minutes   Status On-going               Plan - 01/20/14 0954    Clinical Impression Statement Mr Carpenter felt fine post traction      Pt will benefit from skilled therapeutic intervention in order to improve on the following deficits Impaired flexibility;Pain;Increased muscle spasms;Decreased activity tolerance;Decreased range of motion   Rehab Potential Good   PT Frequency 2x / week   PT Duration 2 weeks   PT Treatment/Interventions  Therapeutic exercise;Traction;Manual techniques   PT Next Visit Plan Resume manual if still improved with traction, add stab exercises   PT Home Exercise Plan stab exercises,    Consulted and Agree with Plan of Care Patient        Problem List Patient Active Problem List   Diagnosis Date Noted  . Piriformis syndrome of left side 12/15/2013  . Right lumbar radiculitis 10/04/2013  . Elevated blood pressure reading without diagnosis of hypertension 01/22/2013  . Abnormal MRI of head 08/25/2012  . Headache(784.0) 08/19/2012  . Hyperlipidemia LDL goal < 130 08/19/2012    Darrel Hoover 01/20/2014, 9:57 AM  [place], [place], [address] Phone: [phone]   Fax:  [phone]

## 2014-01-24 ENCOUNTER — Ambulatory Visit: Payer: Managed Care, Other (non HMO)

## 2014-01-24 DIAGNOSIS — M25652 Stiffness of left hip, not elsewhere classified: Secondary | ICD-10-CM

## 2014-01-24 DIAGNOSIS — R200 Anesthesia of skin: Secondary | ICD-10-CM

## 2014-01-24 DIAGNOSIS — R202 Paresthesia of skin: Secondary | ICD-10-CM

## 2014-01-24 DIAGNOSIS — M25552 Pain in left hip: Secondary | ICD-10-CM

## 2014-01-24 DIAGNOSIS — M25569 Pain in unspecified knee: Secondary | ICD-10-CM | POA: Diagnosis not present

## 2014-01-24 DIAGNOSIS — M25659 Stiffness of unspecified hip, not elsewhere classified: Secondary | ICD-10-CM

## 2014-01-24 DIAGNOSIS — M2560 Stiffness of unspecified joint, not elsewhere classified: Secondary | ICD-10-CM

## 2014-01-24 NOTE — Therapy (Signed)
[place], [place], [address] Phone: [phone]   Fax:  [phone]  Physical Therapy Treatment  Patient Details  Name: Christopher Joyce MRN: [id number] Date of Birth: 04-28-1960 Referring Provider:  Janith Lima, MD  Encounter Date: 01/24/2014      PT End of Session - 01/24/14 1127    Visit Number 8   Number of Visits 16   Date for PT Re-Evaluation 02/22/14   PT Start Time 1100   PT Stop Time 1140   PT Time Calculation (min) 40 min   Activity Tolerance Patient tolerated treatment well   Behavior During Therapy [facility] for tasks assessed/performed      Past Medical History  Diagnosis Date  . URKYHCWC(376.2)     Past Surgical History  Procedure Laterality Date  . None      There were no vitals taken for this visit.  Visit Diagnosis:  Stiffness of left hip joint  Joint stiffness of spine  Pain, hip, left  Stiffness of hip joint, unspecified laterality  Pain in joint, pelvic region and thigh, left  Numbness and tingling of left leg      Subjective Assessment - 01/24/14 1102    Symptoms No pain, pleased. Numbness in lower leg also with some quad tightness    Currently in Pain? No/denies   Pain Type Chronic pain   Pain Onset More than a month ago   Multiple Pain Sites No                    [facility] Adult PT Treatment/Exercise - 01/24/14 1100    Lumbar Exercises: Aerobic   Stationary Bike Nustep Level 6 x 8 min   Traction   Type of Traction Lumbar   Min (lbs) 35   Max (lbs) 85   Hold Time 60   Rest Time 15   Time 12   Manual Therapy   Manual Therapy Joint mobilization   Joint Mobilization Gr 4 Lumbar and sacrum 45-60 reps   Myofascial Release Quadracep and hip rotation stretches with STW and motion Rt and LT                  PT Short Term Goals - 01/18/14 1130    PT SHORT TERM GOAL #1   Title He will be independent with intial HEP   Status Achieved   PT SHORT TERM GOAL #2   Title He will report pain decr 40% or more in RT hip and thigh   Status Achieved  but appears to be getting worse   PT SHORT TERM GOAL #3   Title He will improve LT hip range to allow for decr pain    Status On-going           PT Long Term Goals - 01/18/14 1131    PT LONG TERM GOAL #1   Title independent with HEP issued as of last visit   Status On-going   PT LONG TERM GOAL #2   Title He will report pain decr 75% or mroe with normal work and home tasks   Status On-going   PT LONG TERM GOAL #3   Title Report able to be on feet for >60 minutes without inc. pain  Only able to stand 10 minutes   Status On-going               Plan - 01/24/14 1128    Clinical Impression Statement improved with lumbar stiffness and  hip pain . Numbness not improved but no numbness with stretching   Rehab Potential Good   PT Frequency 2x / week   PT Duration 2 weeks   PT Treatment/Interventions Therapeutic exercise;Traction;Manual techniques   PT Next Visit Plan  add stab exercises, ? foam roller work   [employer] and Agree with Plan of Care Patient        Problem List Patient Active Problem List   Diagnosis Date Noted  . Piriformis syndrome of left side 12/15/2013  . Right lumbar radiculitis 10/04/2013  . Elevated blood pressure reading without diagnosis of hypertension 01/22/2013  . Abnormal MRI of head 08/25/2012  . Headache(784.0) 08/19/2012  . Hyperlipidemia LDL goal < 130 08/19/2012    Darrel Hoover PT 01/24/2014, 11:30 AM  [facility] [address] [place], [place], [address] Phone: [phone]   Fax:  [phone]

## 2014-01-26 ENCOUNTER — Ambulatory Visit: Payer: Managed Care, Other (non HMO) | Admitting: Rehabilitation

## 2014-01-26 DIAGNOSIS — M25552 Pain in left hip: Secondary | ICD-10-CM

## 2014-01-26 DIAGNOSIS — M25652 Stiffness of left hip, not elsewhere classified: Secondary | ICD-10-CM

## 2014-01-26 DIAGNOSIS — M2560 Stiffness of unspecified joint, not elsewhere classified: Secondary | ICD-10-CM

## 2014-01-26 DIAGNOSIS — M25569 Pain in unspecified knee: Secondary | ICD-10-CM | POA: Diagnosis not present

## 2014-01-26 DIAGNOSIS — M25659 Stiffness of unspecified hip, not elsewhere classified: Secondary | ICD-10-CM

## 2014-01-26 DIAGNOSIS — R200 Anesthesia of skin: Secondary | ICD-10-CM

## 2014-01-26 DIAGNOSIS — R202 Paresthesia of skin: Secondary | ICD-10-CM

## 2014-01-26 NOTE — Therapy (Signed)
[place], [place], [address] Phone: [phone]   Fax:  [phone]  Physical Therapy Treatment  Patient Details  Name: Christopher Joyce MRN: [id number] Date of Birth: [birth year] Referring Provider:  Janith Lima, MD  Encounter Date: 01/26/2014      PT End of Session - 01/26/14 1659    Visit Number 9   Number of Visits 16   Date for PT Re-Evaluation 02/22/14   PT Start Time 0428   PT Stop Time 0515   PT Time Calculation (min) 47 min      Past Medical History  Diagnosis Date  . FIEPPIRJ(188.4)     Past Surgical History  Procedure Laterality Date  . None      There were no vitals taken for this visit.  Visit Diagnosis:  Stiffness of left hip joint  Joint stiffness of spine  Pain, hip, left  Stiffness of hip joint, unspecified laterality  Numbness and tingling of left leg      Subjective Assessment - 01/26/14 1628    Symptoms No pain but numbness present in lower left leg   Currently in Pain? No/denies                    [facility] Adult PT Treatment/Exercise - 01/26/14 1632    Lumbar Exercises: Supine   Bridge 10 reps   Other Supine Lumbar Exercises Bridge with 10 clams, 10 marches, single leg bridge x 10 each   Other Supine Lumbar Exercises Bridge with calves on green physioball x 10   Lumbar Exercises: Prone   Straight Leg Raise 10 reps   Opposite Arm/Leg Raise 10 reps   Lumbar Exercises: Quadruped   Madcat/Old Horse 10 reps   Straight Leg Raise 10 reps;5 seconds  cues for core bracing   Opposite Arm/Leg Raise 10 reps;5 seconds   Traction   Min (lbs) 40   Max (lbs) 90   Hold Time 60   Rest Time 15   Time 13                  PT Short Term Goals - 01/18/14 1130    PT SHORT TERM GOAL #1   Title He will be independent with intial HEP   Status Achieved   PT SHORT TERM GOAL #2   Title He will report pain decr 40% or more in RT hip and thigh   Status  Achieved  but appears to be getting worse   PT SHORT TERM GOAL #3   Title He will improve LT hip range to allow for decr pain    Status On-going           PT Long Term Goals - 01/18/14 1131    PT LONG TERM GOAL #1   Title independent with HEP issued as of last visit   Status On-going   PT LONG TERM GOAL #2   Title He will report pain decr 75% or mroe with normal work and home tasks   Status On-going   PT [place] #3   Title Report able to be on feet for >60 minutes without inc. pain  Only able to stand 10 minutes   Status On-going               Plan - 01/26/14 1656    Clinical Impression Statement Pt reports numbness only present with standing, not walking and is intermittent.    PT Next Visit Plan repeat  stabilization, add to HEP, FOTO if needed        Problem List Patient Active Problem List   Diagnosis Date Noted  . Piriformis syndrome of left side 12/15/2013  . Right lumbar radiculitis 10/04/2013  . Elevated blood pressure reading without diagnosis of hypertension 01/22/2013  . Abnormal MRI of head 08/25/2012  . Headache(784.0) 08/19/2012  . Hyperlipidemia LDL goal < 130 08/19/2012    Dorene Ar, [place] 01/26/2014, 5:00 PM  [place], [place], [address] Phone: [phone]   Fax:  [phone]

## 2014-02-01 ENCOUNTER — Ambulatory Visit: Payer: Managed Care, Other (non HMO) | Admitting: Rehabilitation

## 2014-02-01 DIAGNOSIS — M25569 Pain in unspecified knee: Secondary | ICD-10-CM | POA: Diagnosis not present

## 2014-02-01 DIAGNOSIS — R202 Paresthesia of skin: Secondary | ICD-10-CM

## 2014-02-01 DIAGNOSIS — M25652 Stiffness of left hip, not elsewhere classified: Secondary | ICD-10-CM

## 2014-02-01 DIAGNOSIS — R200 Anesthesia of skin: Secondary | ICD-10-CM

## 2014-02-01 DIAGNOSIS — M2560 Stiffness of unspecified joint, not elsewhere classified: Secondary | ICD-10-CM

## 2014-02-01 DIAGNOSIS — M25552 Pain in left hip: Secondary | ICD-10-CM

## 2014-02-01 DIAGNOSIS — M25659 Stiffness of unspecified hip, not elsewhere classified: Secondary | ICD-10-CM

## 2014-02-01 NOTE — Therapy (Signed)
[place], [place], [address] Phone: [phone]   Fax:  [phone]  Physical Therapy Treatment  Patient Details  Name: Christopher Joyce MRN: [id number] Date of Birth: 1960/04/19 Referring Provider:  Janith Lima, MD  Encounter Date: 02/01/2014      PT End of Session - 02/01/14 1630    Visit Number 10   Number of Visits 16   Date for PT Re-Evaluation 02/22/14   PT Start Time 0345   PT Stop Time 0430   PT Time Calculation (min) 45 min      Past Medical History  Diagnosis Date  . DGLOVFIE(332.9)     Past Surgical History  Procedure Laterality Date  . None      There were no vitals taken for this visit.  Visit Diagnosis:  Joint stiffness of spine  Stiffness of left hip joint  Pain, hip, left  Stiffness of hip joint, unspecified laterality  Numbness and tingling of left leg  Pain in joint, pelvic region and thigh, left      Subjective Assessment - 02/01/14 1618    Symptoms no pain a little numbness after sitting in lobby   Pertinent History 4-5 mionths ago He began to have pain in back and Rt leg without injury but came on suddeny. He had epidural injection. He began to have Lt hip thigh pain started 3 weeks ago. He uses tennis ball in Lt hip    Currently in Pain? No/denies                    [facility] Adult PT Treatment/Exercise - 02/01/14 1623    Lumbar Exercises: Stretches   Hip Flexor Stretch 3 reps;30 seconds   Quad Stretch 3 reps;30 seconds   Piriformis Stretch 3 reps;30 seconds   Lumbar Exercises: Sidelying   Other Sidelying Lumbar Exercises [place] 3 x 30 each wit breathing   Lumbar Exercises: Prone   Other Prone Lumbar Exercises Active release bilat piriformis   Knee/Hip Exercises: Seated   Other Seated Knee Exercises Seated Piriformis tennis ball release with active stretch                  PT Short Term Goals - 01/18/14 1130    PT SHORT TERM  GOAL #1   Title He will be independent with intial HEP   Status Achieved   PT SHORT TERM GOAL #2   Title He will report pain decr 40% or more in RT hip and thigh   Status Achieved  but appears to be getting worse   PT SHORT TERM GOAL #3   Title He will improve LT hip range to allow for decr pain    Status On-going           PT Long Term Goals - 02/01/14 1632    PT LONG TERM GOAL #1   Title independent with HEP issued as of last visit   Time 8   Period Weeks   Status On-going   PT LONG TERM GOAL #2   Title He will report pain decr 75% or mroe with normal work and home tasks   Time 8   Period Weeks   Status Achieved   PT LONG TERM GOAL #3   Title Report able to be on feet for >60 minutes without inc. pain   Time 8   Period Weeks   Status On-going  Plan - 02/01/14 1619    Clinical Impression Statement pt reports 80% decrease in pain, no longer has pain with first getting up out of bed and getting out of car. It does still happen intermittently with sitting.    PT Next Visit Plan recheck goals, maybe ready for DC, review HEP/ [place]        Problem List Patient Active Problem List   Diagnosis Date Noted  . Piriformis syndrome of left side 12/15/2013  . Right lumbar radiculitis 10/04/2013  . Elevated blood pressure reading without diagnosis of hypertension 01/22/2013  . Abnormal MRI of head 08/25/2012  . Headache(784.0) 08/19/2012  . Hyperlipidemia LDL goal < 130 08/19/2012    Dorene Ar, [place] 02/01/2014, 5:28 PM  [place], [place], [address] Phone: [phone]   Fax:  [phone]

## 2014-02-03 ENCOUNTER — Ambulatory Visit: Payer: Managed Care, Other (non HMO)

## 2014-02-03 ENCOUNTER — Telehealth: Payer: Self-pay | Admitting: *Deleted

## 2014-02-03 DIAGNOSIS — M25659 Stiffness of unspecified hip, not elsewhere classified: Secondary | ICD-10-CM

## 2014-02-03 DIAGNOSIS — R202 Paresthesia of skin: Secondary | ICD-10-CM

## 2014-02-03 DIAGNOSIS — M25569 Pain in unspecified knee: Secondary | ICD-10-CM | POA: Diagnosis not present

## 2014-02-03 DIAGNOSIS — M25652 Stiffness of left hip, not elsewhere classified: Secondary | ICD-10-CM

## 2014-02-03 DIAGNOSIS — R200 Anesthesia of skin: Secondary | ICD-10-CM

## 2014-02-03 DIAGNOSIS — M2560 Stiffness of unspecified joint, not elsewhere classified: Secondary | ICD-10-CM

## 2014-02-03 DIAGNOSIS — M25552 Pain in left hip: Secondary | ICD-10-CM

## 2014-02-03 NOTE — Therapy (Signed)
[place], [place], [address] Phone: [phone]   Fax:  [phone]  Physical Therapy Treatment  Patient Details  Name: Christopher Joyce MRN: [id number] Date of Birth: 1960/09/14 Referring Provider:  Lyndal Pulley, DO  Encounter Date: 02/03/2014      PT End of Session - 02/03/14 1643    Visit Number 11   Date for PT Re-Evaluation 02/22/14   PT Start Time 1545   PT Stop Time 1640   PT Time Calculation (min) 55 min   Activity Tolerance Patient tolerated treatment well   Behavior During Therapy [facility] for tasks assessed/performed      Past Medical History  Diagnosis Date  . UYZJQDUK(383.8)     Past Surgical History  Procedure Laterality Date  . None      There were no vitals taken for this visit.  Visit Diagnosis:  Joint stiffness of spine  Stiffness of left hip joint  Pain, hip, left  Stiffness of hip joint, unspecified laterality  Numbness and tingling of left leg      Subjective Assessment - 02/03/14 1549    Symptoms No pain today, and just a twinge of numbness.    Currently in Pain? No/denies   Pain Onset More than a month ago   Pain Frequency Intermittent   Multiple Pain Sites No                    [facility] Adult PT Treatment/Exercise - 02/03/14 1550    Lumbar Exercises: Stretches   Press Ups 3 reps  5 seconds 3 sets   Piriformis Stretch 3 reps;30 seconds  Rt and LT   Lumbar Exercises: Aerobic   Stationary Bike Nustep Level 6 x 8 min   Lumbar Exercises: Quadruped   Other Quadruped Lumbar Exercises childs pose 30 sec x 2    Traction   Type of Traction Lumbar   Min (lbs) 40   Max (lbs) 90   Hold Time 60   Rest Time 15   Time 14   Manual Therapy   Joint Mobilization PA glides Gr 3-4 lumbar and sacrum   Myofascial Release hip rotators with rotation of thigh prone   Passive ROM Hip flexors    Other Manual Therapy IT stretch                PT  Education - 02/03/14 1624    Education provided No          PT Short Term Goals - 02/03/14 1645    PT SHORT TERM GOAL #1   Title He will be independent with intial HEP   Status Achieved   PT SHORT TERM GOAL #2   Title He will report pain decr 40% or more in RT hip and thigh   Status Achieved   PT SHORT TERM GOAL #3   Title He will improve LT hip range to allow for decr pain    Status Achieved           PT Long Term Goals - 02/03/14 1645    PT LONG TERM GOAL #1   Title independent with HEP issued as of last visit   Status On-going   PT LONG TERM GOAL #2   Title He will report pain decr 75% or mroe with normal work and home tasks   Status Achieved   PT LONG TERM GOAL #3   Title Report able to be on feet for >60 minutes without inc.  pain   Status On-going               Plan - 02/03/14 1644    Clinical Impression Statement No pain at end of session with reported signig=ficant improvement in pain and numbness. Should be ready for discharge next week   Pt will benefit from skilled therapeutic intervention in order to improve on the following deficits Impaired flexibility;Pain;Increased muscle spasms;Decreased activity tolerance;Decreased range of motion   Rehab Potential Good   PT Frequency 2x / week   PT Duration --  1 week   PT Treatment/Interventions Therapeutic exercise;Traction;Manual techniques   PT Next Visit Plan recheck goals, maybe ready for DC, review HEP/ [place]   Consulted and Agree with Plan of Care Patient        Problem List Patient Active Problem List   Diagnosis Date Noted  . Piriformis syndrome of left side 12/15/2013  . Right lumbar radiculitis 10/04/2013  . Elevated blood pressure reading without diagnosis of hypertension 01/22/2013  . Abnormal MRI of head 08/25/2012  . Headache(784.0) 08/19/2012  . Hyperlipidemia LDL goal < 130 08/19/2012    Darrel Hoover PT 02/03/2014, 4:47 PM  [place]  [facility] [address] [place], [place], [address] Phone: [phone]   Fax:  [phone]

## 2014-02-03 NOTE — Telephone Encounter (Signed)
appts made and printed...td 

## 2014-02-17 ENCOUNTER — Ambulatory Visit: Payer: Managed Care, Other (non HMO) | Attending: Family Medicine

## 2014-02-17 DIAGNOSIS — M25652 Stiffness of left hip, not elsewhere classified: Secondary | ICD-10-CM

## 2014-02-17 DIAGNOSIS — M25659 Stiffness of unspecified hip, not elsewhere classified: Secondary | ICD-10-CM | POA: Diagnosis present

## 2014-02-17 DIAGNOSIS — M25569 Pain in unspecified knee: Secondary | ICD-10-CM | POA: Diagnosis not present

## 2014-02-17 DIAGNOSIS — M25552 Pain in left hip: Secondary | ICD-10-CM | POA: Insufficient documentation

## 2014-02-17 DIAGNOSIS — R202 Paresthesia of skin: Secondary | ICD-10-CM

## 2014-02-17 DIAGNOSIS — R200 Anesthesia of skin: Secondary | ICD-10-CM

## 2014-02-17 DIAGNOSIS — M2560 Stiffness of unspecified joint, not elsewhere classified: Secondary | ICD-10-CM

## 2014-02-17 NOTE — Therapy (Addendum)
[place], [place], [address] Phone: [phone]   Fax:  [phone]  Physical Therapy Treatment  Patient Details  Name: Christopher Joyce MRN: [id number] Date of Birth: [birth year] Referring Provider:  Janith Lima, MD  Encounter Date: 02/17/2014      PT End of Session - 02/17/14 1440    Visit Number 12   Number of Visits 16   Date for PT Re-Evaluation 02/22/14   PT Start Time 1410   PT Stop Time 1500   PT Time Calculation (min) 50 min   Activity Tolerance Patient tolerated treatment well   Behavior During Therapy [facility] for tasks assessed/performed      Past Medical History  Diagnosis Date  . CHENIDPO(242.3)     Past Surgical History  Procedure Laterality Date  . None      There were no vitals taken for this visit.  Visit Diagnosis:  Joint stiffness of spine  Stiffness of left hip joint  Numbness and tingling of left leg  Stiffness of hip joint, unspecified laterality      Subjective Assessment - 02/17/14 1411    Symptoms No pain today. Feel tingling is decreased also .   Tightnes in back at times and stretching eases this   Currently in Pain? No/denies   Multiple Pain Sites No                    [facility] Adult PT Treatment/Exercise - 02/17/14 1413    Lumbar Exercises: Stretches   Hip Flexor Stretch 2 reps;30 seconds  with lateral lean opposite down leg.    Lumbar Exercises: Aerobic   Stationary Bike Nustep Level 6 x 8 min   Lumbar Exercises: Quadruped   Other Quadruped Lumbar Exercises childs pose 30 sec x 2    Traction   Type of Traction Lumbar   Min (lbs) 40   Max (lbs) 90   Hold Time 60   Rest Time 15   Time 14   Manual Therapy   Joint Mobilization PA glides GR4 SI joint and L5 through T 12                  PT Short Term Goals - 02/03/14 1645    PT SHORT TERM GOAL #1   Title He will be independent with intial HEP   Status Achieved   PT SHORT  TERM GOAL #2   Title He will report pain decr 40% or more in RT hip and thigh   Status Achieved   PT SHORT TERM GOAL #3   Title He will improve LT hip range to allow for decr pain    Status Achieved           PT Long Term Goals - 02/17/14 1441    PT LONG TERM GOAL #1   Title independent with HEP issued as of last visit   Status Achieved   PT LONG TERM GOAL #2   Title He will report pain decr 75% or mroe with normal work and home tasks   Status Achieved   PT [place] #3   Title Report able to be on feet for >60 minutes without inc. pain   Status Achieved               Plan - 02/17/14 1440    Clinical Impression Statement Continues with stiffness of spine and hips but back pain improved and parasthesias improved (mild past week)  Consulted and Agree with Plan of Care Patient        Problem List Patient Active Problem List   Diagnosis Date Noted  . Piriformis syndrome of left side 12/15/2013  . Right lumbar radiculitis 10/04/2013  . Elevated blood pressure reading without diagnosis of hypertension 01/22/2013  . Abnormal MRI of head 08/25/2012  . Headache(784.0) 08/19/2012  . Hyperlipidemia LDL goal < 130 08/19/2012    Darrel Hoover PT 02/17/2014, 2:43 PM  [place] [facility] [address] [place], [place], [address] Phone: [phone]   Fax:  [phone]     PHYSICAL THERAPY DISCHARGE SUMMARY  Visits from Start of Care: 12  Current functional level related to goals / functional outcomes: See above   Remaining deficits: See Above   Education / Equipment: HEP  Plan: Patient agrees to discharge.  Patient goals were met. Patient is being discharged due to meeting the stated rehab goals.  ?????   Christopher Joyce   PT   08/29/14      2:04 PM

## 2014-10-12 ENCOUNTER — Ambulatory Visit (INDEPENDENT_AMBULATORY_CARE_PROVIDER_SITE_OTHER): Payer: Managed Care, Other (non HMO) | Admitting: Adult Health

## 2014-10-12 ENCOUNTER — Encounter: Payer: Self-pay | Admitting: Adult Health

## 2014-10-12 VITALS — Ht 66.25 in | Wt 212.0 lb

## 2014-10-12 DIAGNOSIS — R51 Headache: Secondary | ICD-10-CM

## 2014-10-12 DIAGNOSIS — R519 Headache, unspecified: Secondary | ICD-10-CM

## 2014-10-12 NOTE — Progress Notes (Signed)
I have read the note, and I agree with the clinical assessment and plan.  Jaryn Rosko KEITH   

## 2014-10-12 NOTE — Patient Instructions (Signed)
If your symptoms worsen or you develop new symptoms please let us know.   

## 2014-10-12 NOTE — Progress Notes (Signed)
PATIENT: Christopher Joyce DOB: [birth year]  REASON FOR VISIT: follow up- migraine HISTORY FROM: patient  HISTORY OF PRESENT ILLNESS: Christopher Joyce is a 54 year old male with a history of migraine headaches. He returns today for follow-up. At the last visit nortriptyline was discontinued as the patient was only using it on an as-needed basis and his headache frequency had improved drastically. The patient reports that he continues to have very few headaches. If he does get a headache he normally can take over-the-counter Motrin and that is beneficial. The patient was also having some low back pain and he received epidural steroid injections. He also went to physical therapy. He states that his low back pain has improved and he feels that this also improved his headache frequency. The patient also changes his pillows out every 6 months and he feels this has been beneficial for his headaches. He denies any new symptoms. He returns today for an evaluation.  HISTORY 10/11/13: Christopher Joyce is a 54 year old male with a history of migraines. He returns today for follow-up. He is currently taking nortriptyline 20 mg daily but will only take it when he has a headache. He reports that he has approximately 1 headache every other month. He states that his headaches have improved drastically and is not sure that he needs the nortriptyline. He has been having low back pain that radiates down the right leg. His PCP has ordered an MRI of the lumbar spine to evaluate. Right now he is taking percocet for the back pain. No new neurological complaints.   HISTORY 01/12/13 (CM): 54 year old black male returns for followup. He has a history of headaches since May of 2014. He was initially evaluated by Dr. Jannifer Franklin 09/03/2012 and placed on nortriptyline at that time. He is currently taking 20 mg at bedtime. His headaches have gone from daily to 2 headaches per month. He now thinks these headaches are due to stress. He denies any  side effects to the nortriptyline. He has no new complaints  HISTORY:of headaches that began in May 2014. The patient claims that prior to this, he never had headaches at any point in his life. The patient indicates that the headaches are bifrontal in nature, and are almost daily. The headaches are associated with a pressure sensation in the frontal regions bilaterally. Occasionally, he may note some neck stiffness. The patient does occasionally have some left shoulder numbness that developed within the last 1 week. The patient reports no weakness of the extremities, and he reports no dizziness, visual changes, nausea or vomiting, or gait instability. The patient denies problems controlling the bowels or the bladder. The patient has undergone blood work that includes a normal sedimentation rate. A MRI of the brain was done, and this shows minimal small vessel disease. The patient occasionally will take Advil for the headache with good improvement. The patient indicates that he sleeps well at night, and he has no fatigue during the day. The headaches often times are slightly worse in the morning, better in the evening. The patient is sent to this office for an evaluation   REVIEW OF SYSTEMS: Out of a complete 14 system review of symptoms, the patient complains only of the following symptoms, and all other reviewed systems are negative.  Headache, back pain, neck stiffness  ALLERGIES: No Known Allergies  HOME MEDICATIONS: Outpatient Prescriptions Prior to Visit  Medication Sig Dispense Refill  . aspirin 81 MG tablet Take 81 mg by mouth daily.    Marland Kitchen  cyclobenzaprine (FLEXERIL) 10 MG tablet Take 1 tablet (10 mg total) by mouth 3 (three) times daily as needed for muscle spasms. 30 tablet 0  . meloxicam (MOBIC) 15 MG tablet Take 1 tablet (15 mg total) by mouth daily. 30 tablet 3  . metoprolol tartrate (LOPRESSOR) 25 MG tablet Take 1 tablet (25 mg total) by mouth 2 (two) times daily. (Patient not taking:  Reported on 12/22/2013) 60 tablet 2  . nortriptyline (PAMELOR) 10 MG capsule Take 2 capsules (20 mg total) by mouth at bedtime. 60 capsule 3  . Omega-3 Fatty Acids (FISH OIL) 1200 MG CAPS Take by mouth daily.    . traMADol (ULTRAM) 50 MG tablet Take 1 tablet (50 mg total) by mouth at bedtime as needed. 30 tablet 0   No facility-administered medications prior to visit.    PAST MEDICAL HISTORY: Past Medical History  Diagnosis Date  . Headache(784.0)     PAST SURGICAL HISTORY: Past Surgical History  Procedure Laterality Date  . None      FAMILY HISTORY: Family History  Problem Relation Age of Onset  . Diabetes Mother   . Hyperlipidemia Father   . Heart attack Father 72  . Stroke Paternal Grandfather   . Cancer Neg Hx   . Hypertension Neg Hx   . Kidney disease Neg Hx   . Heart attack Brother 25  . Hyperlipidemia Brother     SOCIAL HISTORY: Social History   Social History  . Marital Status: Divorced    Spouse Name: N/A  . Number of Children: 5  . Years of Education: Assoc.5   Occupational History  .      [employer]   Social History Main Topics  . Smoking status: Former Research scientist (life sciences)  . Smokeless tobacco: Never Used     Comment: quit smoking 2008  . Alcohol Use: No  . Drug Use: No  . Sexual Activity: Yes   Other Topics Concern  . Not on file   Social History Narrative   Patient lives at home with his mom Terrence Dupont.    Patient has 5 children.    Patient is Divorced.    Patient is working full-time.    Patient is right-handed      PHYSICAL EXAM  Filed Vitals:   10/12/14 1030  Height: 5' 6.25" (1.683 m)  Weight: 212 lb (96.163 kg)   Body mass index is 33.95 kg/(m^2).  Generalized: Well developed, in no acute distress   Neurological examination  Mentation: Alert oriented to time, place, history taking. Follows all commands speech and language fluent Cranial nerve II-XII: Pupils were equal round reactive to light. Extraocular movements were full, visual  field were full on confrontational test. Facial sensation and strength were normal. Uvula tongue midline. Head turning and shoulder shrug  were normal and symmetric. Motor: The motor testing reveals 5 over 5 strength of all 4 extremities. Good symmetric motor tone is noted throughout.  Sensory: Sensory testing is intact to soft touch on all 4 extremities. No evidence of extinction is noted.  Coordination: Cerebellar testing reveals good finger-nose-finger and heel-to-shin bilaterally.  Gait and station: Gait is normal. Tandem gait is normal. Romberg is negative. No drift is seen.  Reflexes: Deep tendon reflexes are symmetric and normal bilaterally.   DIAGNOSTIC DATA (LABS, IMAGING, TESTING) - I reviewed patient records, labs, notes, testing and imaging myself where available.      ASSESSMENT AND PLAN 54 y.o. year old male  has a past medical history of Headache(784.0). here  with:  1. Headache  Overall the patient is doing well. His headache frequency has improved drastically. He uses over-the-counter Motrin when he does get a headache. Patient advised that if his headache severity or frequency increases he should let us know. Otherwise he will follow-up with our office on an as-needed basis.     Ward Givens, MSN, NP-C 10/12/2014, 10:25 AM [facility] [address], [place], [place] [address] [phone]

## 2018-03-18 ENCOUNTER — Encounter: Payer: Self-pay | Admitting: Gastroenterology

## 2018-03-18 ENCOUNTER — Encounter: Payer: Self-pay | Admitting: Family Medicine

## 2018-03-18 ENCOUNTER — Ambulatory Visit: Payer: Managed Care, Other (non HMO) | Admitting: Family Medicine

## 2018-03-18 ENCOUNTER — Other Ambulatory Visit: Payer: Self-pay

## 2018-03-18 VITALS — BP 132/80 | HR 67 | Ht 66.25 in | Wt 219.0 lb

## 2018-03-18 DIAGNOSIS — Z0001 Encounter for general adult medical examination with abnormal findings: Secondary | ICD-10-CM

## 2018-03-18 DIAGNOSIS — E663 Overweight: Secondary | ICD-10-CM | POA: Insufficient documentation

## 2018-03-18 DIAGNOSIS — Z0000 Encounter for general adult medical examination without abnormal findings: Secondary | ICD-10-CM | POA: Insufficient documentation

## 2018-03-18 DIAGNOSIS — E669 Obesity, unspecified: Secondary | ICD-10-CM | POA: Diagnosis not present

## 2018-03-18 DIAGNOSIS — R0789 Other chest pain: Secondary | ICD-10-CM

## 2018-03-18 DIAGNOSIS — L308 Other specified dermatitis: Secondary | ICD-10-CM

## 2018-03-18 DIAGNOSIS — Z6836 Body mass index (BMI) 36.0-36.9, adult: Secondary | ICD-10-CM | POA: Insufficient documentation

## 2018-03-18 DIAGNOSIS — Z1211 Encounter for screening for malignant neoplasm of colon: Secondary | ICD-10-CM | POA: Insufficient documentation

## 2018-03-18 DIAGNOSIS — L309 Dermatitis, unspecified: Secondary | ICD-10-CM | POA: Insufficient documentation

## 2018-03-18 LAB — COMPREHENSIVE METABOLIC PANEL
ALT: 21 U/L (ref 0–53)
AST: 31 U/L (ref 0–37)
Albumin: 4.7 g/dL (ref 3.5–5.2)
Alkaline Phosphatase: 37 U/L — ABNORMAL LOW (ref 39–117)
BILIRUBIN TOTAL: 0.6 mg/dL (ref 0.2–1.2)
BUN: 11 mg/dL (ref 6–23)
CO2: 28 meq/L (ref 19–32)
CREATININE: 1.11 mg/dL (ref 0.40–1.50)
Calcium: 10.1 mg/dL (ref 8.4–10.5)
Chloride: 99 mEq/L (ref 96–112)
GFR: 82.46 mL/min (ref >60.00)
Glucose, Bld: 85 mg/dL (ref 70–99)
Potassium: 4.6 mEq/L (ref 3.5–5.1)
Sodium: 136 mEq/L (ref 135–145)
TOTAL PROTEIN: 7.8 g/dL (ref 6.0–8.3)

## 2018-03-18 LAB — URINALYSIS, ROUTINE W REFLEX MICROSCOPIC
Bilirubin Urine: NEGATIVE
Hgb urine dipstick: NEGATIVE
Ketones, ur: NEGATIVE
Leukocytes,Ua: NEGATIVE
Nitrite: NEGATIVE
RBC / HPF: NONE SEEN (ref 0–?)
Specific Gravity, Urine: 1.015 (ref 1.000–1.030)
Total Protein, Urine: NEGATIVE
Urine Glucose: NEGATIVE
Urobilinogen, UA: 0.2 (ref 0.0–1.0)
pH: 6 (ref 5.0–8.0)

## 2018-03-18 LAB — LIPID PANEL
Cholesterol: 228 mg/dL — ABNORMAL HIGH (ref 0–200)
HDL: 37.2 mg/dL — ABNORMAL LOW (ref >39.00)
LDL CALC: 167 mg/dL — AB (ref 0–99)
NonHDL: 190.79
TRIGLYCERIDES: 117 mg/dL (ref 0.0–149.0)
Total CHOL/HDL Ratio: 6
VLDL: 23.4 mg/dL (ref 0.0–40.0)

## 2018-03-18 LAB — CBC
HCT: 47.6 % (ref 39.0–52.0)
Hemoglobin: 16 g/dL (ref 13.0–17.0)
MCHC: 33.6 g/dL (ref 30.0–36.0)
MCV: 90.7 fl (ref 78.0–100.0)
Platelets: 235 10*3/uL (ref 150.0–400.0)
RBC: 5.25 Mil/uL (ref 4.22–5.81)
RDW: 14.1 % (ref 11.5–15.5)
WBC: 4.6 10*3/uL (ref 4.0–10.5)

## 2018-03-18 LAB — PSA: PSA: 0.49 ng/mL (ref 0.10–4.00)

## 2018-03-18 MED ORDER — TRIAMCINOLONE ACETONIDE 0.1 % EX OINT: TOPICAL_OINTMENT | CUTANEOUS | 1 refills | Status: DC

## 2018-03-18 NOTE — Progress Notes (Signed)
Established Patient Office Visit  Subjective:  Patient ID: Christopher Joyce, male    DOB: 27-Jun-1960  Age: 58 y.o. MRN: [id number]  CC:  Chief Complaint  Patient presents with  . Establish Care    HPI Christopher Joyce presents for establishment of care with a complete physical exam and follow-up of some medical issues of concern.  Patient has been dealing with a rash in the distribution of the rubber straps of his fit bit and also on his right posterior forearm where his arm rests on the consult.  He has no history of eczema or psoriasis.  He has been experiencing intermittent chest pressure in the left upper chest area.  There is no shortness of breath or exertional component to it.  He has a significant past medical history of life ending MIs and is 66 year old father and 88 year old brother.  Patient quit smoking 11 years ago.  He consumes a healthy diet.  He achieves 10,000 steps by walking on most days of the week.  He does not drink alcohol or use illicit drugs.  He lives alone.  He works with Radio [employer].  He is working on a Special educational needs teacher for [employer] and is found this somewhat stressful.  He has never had a colonoscopy.  Stools of been regular without blood.  Urine flow is good.  He experiences nocturia on occasion.  Past Medical History:  Diagnosis Date  . Chicken pox   . Headache(784.0)   . Hyperlipidemia     Past Surgical History:  Procedure Laterality Date  . None      Family History  Problem Relation Age of Onset  . Diabetes Mother   . Stroke Mother   . Hyperlipidemia Father   . Heart attack Father 48  . Early death Father   . Heart disease Father   . Heart attack Brother 14  . Hyperlipidemia Brother   . Early death Sister   . Drug abuse Sister   . Diabetes Brother   . Stroke Paternal Grandfather   . Heart attack Paternal Grandfather   . Diabetes Maternal Grandmother   . Cancer Neg Hx   . Hypertension Neg Hx   . Kidney disease Neg Hx      Social History   Socioeconomic History  . Marital status: Divorced    Spouse name: Not on file  . Number of children: 5  . Years of education: Assoc.5  . Highest education level: Not on file  Occupational History    Comment: [employer]  Social Needs  . Financial resource strain: Not on file  . Food insecurity:    Worry: Not on file    Inability: Not on file  . Transportation needs:    Medical: Not on file    Non-medical: Not on file  Tobacco Use  . Smoking status: Former Research scientist (life sciences)  . Smokeless tobacco: Never Used  . Tobacco comment: quit smoking 2008  Substance and Sexual Activity  . Alcohol use: No  . Drug use: No  . Sexual activity: Yes  Lifestyle  . Physical activity:    Days per week: Not on file    Minutes per session: Not on file  . Stress: Not on file  Relationships  . Social connections:    Talks on phone: Not on file    Gets together: Not on file    Attends religious service: Not on file    Active member of club or organization: Not on file  Attends meetings of clubs or organizations: Not on file    Relationship status: Not on file  . Intimate partner violence:    Fear of current or ex partner: Not on file    Emotionally abused: Not on file    Physically abused: Not on file    Forced sexual activity: Not on file  Other Topics Concern  . Not on file  Social History Narrative   Patient lives at home with his mom Terrence Dupont.    Patient has 5 children.    Patient is Divorced.    Patient is working full-time.    Patient is right-handed    Outpatient Medications Prior to Visit  Medication Sig Dispense Refill  . aspirin 81 MG tablet Take 81 mg by mouth daily.    . Omega-3 Fatty Acids (FISH OIL) 1200 MG CAPS Take by mouth daily.    . TURMERIC PO Take 300 mg by mouth daily.    . Flaxseed, Linseed, (FLAX SEED OIL) 1000 MG CAPS Take by mouth.     No facility-administered medications prior to visit.     No Known Allergies  ROS Review of Systems   Constitutional: Negative for chills, diaphoresis, fatigue, fever and unexpected weight change.  HENT: Negative.   Eyes: Negative for photophobia and visual disturbance.  Respiratory: Negative for chest tightness, shortness of breath and wheezing.   Cardiovascular: Negative for chest pain and palpitations.  Gastrointestinal: Negative for anal bleeding and blood in stool.  Endocrine: Negative for polyphagia and polyuria.  Genitourinary: Negative.   Musculoskeletal: Negative for gait problem and joint swelling.  Skin: Positive for color change and rash.  Allergic/Immunologic: Negative for immunocompromised state.  Neurological: Negative for light-headedness and headaches.  Hematological: Does not bruise/bleed easily.  Psychiatric/Behavioral: Negative.       Objective:    Physical Exam  Constitutional: He is oriented to person, place, and time. He appears well-developed and well-nourished. No distress.  HENT:  Head: Normocephalic and atraumatic.  Right Ear: External ear normal.  Left Ear: External ear normal.  Mouth/Throat: Oropharynx is clear and moist. No oropharyngeal exudate.  Eyes: Pupils are equal, round, and reactive to light. Conjunctivae are normal. Right eye exhibits no discharge. Left eye exhibits no discharge. No scleral icterus.  Neck: Neck supple. No JVD present. No tracheal deviation present. No thyromegaly present.  Cardiovascular: Normal rate and normal heart sounds.  Pulmonary/Chest: Effort normal and breath sounds normal. No stridor.  Abdominal: Soft. Bowel sounds are normal. He exhibits no distension. There is no abdominal tenderness. There is no rebound and no guarding.  Genitourinary: Rectum:     Guaiac result negative.     No rectal mass, anal fissure, tenderness, external hemorrhoid, internal hemorrhoid or abnormal anal tone.  Prostate is not enlarged and not tender.  Musculoskeletal:        General: No edema.  Lymphadenopathy:    He has no cervical  adenopathy.  Neurological: He is alert and oriented to person, place, and time.  Skin: Skin is warm and dry. He is not diaphoretic.  Psychiatric: He has a normal mood and affect. His behavior is normal.    BP 132/80   Pulse 67   Ht 5' 6.25" (1.683 m)   Wt 219 lb (99.3 kg)   SpO2 96%   BMI 35.08 kg/m  Wt Readings from Last 3 Encounters:  03/18/18 219 lb (99.3 kg)  10/12/14 212 lb (96.2 kg)  12/15/13 214 lb (97.1 kg)   BP Readings from Last  3 Encounters:  03/18/18 132/80  12/15/13 130/82  11/18/13 (!) 169/79   Guideline developer:  UpToDate (see UpToDate for funding source) Date Released: June 2014  Health Maintenance Due  Topic Date Due  . Hepatitis C Screening  1960/02/24  . HIV Screening  09/03/1975  . COLONOSCOPY  09/03/2010  . INFLUENZA VACCINE  08/07/2017    There are no preventive care reminders to display for this patient.  Lab Results  Component Value Date   TSH 1.00 08/19/2012   Lab Results  Component Value Date   WBC 4.9 08/19/2012   HGB 16.2 08/19/2012   HCT 48.8 08/19/2012   MCV 91.6 08/19/2012   PLT 204.0 08/19/2012   Lab Results  Component Value Date   NA 138 08/19/2012   K 4.0 08/19/2012   CO2 25 08/19/2012   GLUCOSE 90 08/19/2012   BUN 8 08/19/2012   CREATININE 1.1 08/19/2012   BILITOT 0.7 08/19/2012   ALKPHOS 29 (L) 08/19/2012   AST 47 (H) 08/19/2012   ALT 43 08/19/2012   PROT 8.3 08/19/2012   ALBUMIN 4.4 08/19/2012   CALCIUM 9.7 08/19/2012   GFR 92.35 08/19/2012   Lab Results  Component Value Date   CHOL 196 08/19/2012   Lab Results  Component Value Date   HDL 30.00 (L) 08/19/2012   Lab Results  Component Value Date   LDLCALC 143 (H) 08/19/2012   Lab Results  Component Value Date   TRIG 115.0 08/19/2012   Lab Results  Component Value Date   CHOLHDL 7 08/19/2012   No results found for: HGBA1C    Assessment & Plan:   Problem List Items Addressed This Visit      Musculoskeletal and Integument   Eczema    Relevant Medications   triamcinolone ointment (KENALOG) 0.1 %     Other   Encounter for health maintenance examination with abnormal findings - Primary   Relevant Orders   CBC   Comprehensive metabolic panel   Lipid panel   PSA   Urinalysis, Routine w reflex microscopic   Ambulatory referral to Gastroenterology   Chest pressure   Relevant Orders   Lipid panel   ECHOCARDIOGRAM STRESS TEST   Obesity (BMI 35.0-39.9 without comorbidity)      Meds ordered this encounter  Medications  . triamcinolone ointment (KENALOG) 0.1 %    Sig: Apply a lite coat to rash on arms twice daily.    Dispense:  80 g    Refill:  1    Follow-up: Return in about 3 months (around 06/18/2018).   Patient was given information on health maintenance and disease prevention.  He was also given information on nonspecific chest pain and eczema.  He was also given information on the Mediterranean diet.  With his family history we will send for stress test.  Follow-up in 3 months.

## 2018-03-18 NOTE — Patient Instructions (Signed)
Eczema Eczema is a broad term for a group of skin conditions that cause skin to become rough and inflamed. Each type of eczema has different triggers, symptoms, and treatments. Eczema of any type is usually itchy and symptoms range from mild to severe. Eczema and its symptoms are not spread from person to person (are not contagious). It can appear on different parts of the body at different times. Your eczema may not look the same as someone else's eczema. What are the types of eczema? Atopic dermatitis This is a long-term (chronic) skin disease that keeps coming back (recurring). Usual symptoms are dry skin and small, solid pimples that may swell and leak fluid (weep). Contact dermatitis  This happens when something irritates the skin and causes a rash. The irritation can come from substances that you are allergic to (allergens), such as poison ivy, chemicals, or medicines that were applied to your skin. Dyshidrotic eczema This is a form of eczema on the hands and feet. It shows up as very itchy, fluid-filled blisters. It can affect people of any age, but is more common before age 40. Hand eczema  This causes very itchy areas of skin on the palms and sides of the hands and fingers. This type of eczema is common in industrial jobs where you may be exposed to many different types of irritants. Lichen simplex chronicus This type of eczema occurs when a person constantly scratches one area of the body. Repeated scratching of the area leads to thickened skin (lichenification). Lichen simplex chronicus can occur along with other types of eczema. It is more common in adults, but may be seen in children as well. Nummular eczema This is a common type of eczema. It has no known cause. It typically causes a red, circular, crusty lesion (plaque) that may be itchy. Scratching may become a habit and can cause bleeding. Nummular eczema occurs most often in people of middle-age or older. It most often affects the  hands. Seborrheic dermatitis This is a common skin disease that mainly affects the scalp. It may also affect any oily areas of the body, such as the face, sides of nose, eyebrows, ears, eyelids, and chest. It is marked by small scaling and redness of the skin (erythema). This can affect people of all ages. In infants, this condition is known as "cradle cap." Stasis dermatitis This is a common skin disease that usually appears on the legs and feet. It most often occurs in people who have a condition that prevents blood from being pumped through the veins in the legs (chronic venous insufficiency). Stasis dermatitis is a chronic condition that needs long-term management. How is eczema diagnosed? Your health care provider will examine your skin and review your medical history. He or she may also give you skin patch tests. These tests involve taking patches that contain possible allergens and placing them on your back. He or she will then check in a few days to see if an allergic reaction occurred. What are the common treatments? Treatment for eczema is based on the type of eczema you have. Hydrocortisone steroid medicine can relieve itching quickly and help reduce inflammation. This medicine may be prescribed or obtained over-the-counter, depending on the strength of the medicine that is needed. Follow these instructions at home:  Take over-the-counter and prescription medicines only as told by your health care provider.  Use creams or ointments to moisturize your skin. Do not use lotions.  Learn what triggers or irritates your symptoms. Avoid these things.    Treat symptom flare-ups quickly.  Do not itch your skin. This can make your rash worse.  Keep all follow-up visits as told by your health care provider. This is important. Where to find more information  The American Academy of Dermatology: http://jones-macias.info/  The National Eczema Association: www.nationaleczema.org Contact a health care provider  if:  You have serious itching, even with treatment.  You regularly scratch your skin until it bleeds.  Your rash looks different than usual.  Your skin is painful, swollen, or more red than usual.  You have a fever. Summary  There are eight general types of eczema. Each type has different triggers.  Eczema of any type causes itching that may range from mild to severe.  Treatment varies based on the type of eczema you have. Hydrocortisone steroid medicine can help with itching and inflammation.  Protecting your skin is the best way to prevent eczema. Use moisturizers and lotions. Avoid triggers and irritants, and treat flare-ups quickly. This information is not intended to replace advice given to you by your health care provider. Make sure you discuss any questions you have with your health care provider. Document Released: 05/09/2016 Document Revised: 05/09/2016 Document Reviewed: 05/09/2016 Elsevier Interactive Patient Education  2019 [employer]  Nonspecific Chest Pain, Adult Chest pain can be caused by many different conditions. It can be caused by a condition that is life-threatening and requires treatment right away. It can also be caused by something that is not life-threatening. If you have chest pain, it can be hard to know the difference, so it is important to get help right away to make sure that you do not have a serious condition. Some life-threatening causes of chest pain include:  Heart attack.  A tear in the body's main blood vessel (aortic dissection).  Inflammation around your heart (pericarditis).  A problem in the lungs, such as a blood clot (pulmonary embolism) or a collapsed lung (pneumothorax). Some non life-threatening causes of chest pain include:  Heartburn.  Anxiety or stress.  Damage to the bones, muscles, and cartilage that make up your chest wall.  Pneumonia or bronchitis.  Shingles infection (varicella-zoster virus). Chest pain can feel  like:  Pain or discomfort on the surface of your chest or deep in your chest.  Crushing, pressure, aching, or squeezing pain.  Burning or tingling.  Dull or sharp pain that is worse when you move, cough, or take a deep breath.  Pain or discomfort that is also felt in your back, neck, jaw, shoulder, or arm, or pain that spreads to any of these areas. Your chest pain may come and go. It may also be constant. Your health care provider will do lab tests and other studies to find the cause of your pain. Treatment will depend on the cause of your chest pain. Follow these instructions at home: Medicines  Take over-the-counter and prescription medicines only as told by your health care provider.  If you were prescribed an antibiotic, take it as told by your health care provider. Do not stop taking the antibiotic even if you start to feel better. Lifestyle   Rest as directed by your health care provider.  Do not use any products that contain nicotine or tobacco, such as cigarettes and e-cigarettes. If you need help quitting, ask your health care provider.  Do not drink alcohol.  Make healthy lifestyle choices as recommended. These may include: ? Getting regular exercise. Ask your health care provider to suggest some activities that are safe for  you. ? Eating a heart-healthy diet. This includes plenty of fresh fruits and vegetables, whole grains, low-fat (lean) protein, and low-fat dairy products. A dietitian can help you find healthy eating options. ? Maintaining a healthy weight. ? Managing any other health conditions you have, such as high blood pressure (hypertension) or diabetes. ? Reducing stress, such as with yoga or relaxation techniques. General instructions  Pay attention to any changes in your symptoms. Tell your health care provider about them or any new symptoms.  Avoid any activities that cause chest pain.  Keep all follow-up visits as told by your health care provider.  This is important. This includes visits for any further testing if your chest pain does not go away. Contact a health care provider if:  Your chest pain does not go away.  You feel depressed.  You have a fever. Get help right away if:  Your chest pain gets worse.  You have a cough that gets worse, or you cough up blood.  You have severe pain in your abdomen.  You faint.  You have sudden, unexplained chest discomfort.  You have sudden, unexplained discomfort in your arms, back, neck, or jaw.  You have shortness of breath at any time.  You suddenly start to sweat, or your skin gets clammy.  You feel nausea or you vomit.  You suddenly feel lightheaded or dizzy.  You have severe weakness, or unexplained weakness or fatigue.  Your heart begins to beat quickly, or it feels like it is skipping beats. These symptoms may represent a serious problem that is an emergency. Do not wait to see if the symptoms will go away. Get medical help right away. Call your local emergency services (911 in the [place] Do not drive yourself to the hospital. Summary  Chest pain can be caused by a condition that is serious and requires urgent treatment. It may also be caused by something that is not life-threatening.  If you have chest pain, it is very important to see your health care provider. Your health care provider may do lab tests and other studies to find the cause of your pain.  Follow your health care provider's instructions on taking medicines, making lifestyle changes, and getting emergency treatment if symptoms become worse.  Keep all follow-up visits as told by your health care provider. This includes visits for any further testing if your chest pain does not go away. This information is not intended to replace advice given to you by your health care provider. Make sure you discuss any questions you have with your health care provider. Document Released: 10/03/2004 Document Revised:  06/26/2017 Document Reviewed: 06/26/2017 Elsevier Interactive Patient Education  2019 [place] Maintenance, Male A healthy lifestyle and preventive care is important for your health and wellness. Ask your health care provider about what schedule of regular examinations is right for you. What should I know about weight and diet? Eat a Healthy Diet  Eat plenty of vegetables, fruits, whole grains, low-fat dairy products, and lean protein.  Do not eat a lot of foods high in solid fats, added sugars, or salt.  Maintain a Healthy Weight Regular exercise can help you achieve or maintain a healthy weight. You should:  Do at least 150 minutes of exercise each week. The exercise should increase your heart rate and make you sweat (moderate-intensity exercise).  Do strength-training exercises at least twice a week. Watch Your Levels of Cholesterol and Blood Lipids  Have your blood tested for lipids  and cholesterol every 5 years starting at 58 years of age. If you are at high risk for heart disease, you should start having your blood tested when you are 58 years old. You may need to have your cholesterol levels checked more often if: ? Your lipid or cholesterol levels are high. ? You are older than 58 years of age. ? You are at high risk for heart disease. What should I know about cancer screening? Many types of cancers can be detected early and may often be prevented. Lung Cancer  You should be screened every year for lung cancer if: ? You are a current smoker who has smoked for at least 30 years. ? You are a former smoker who has quit within the past 15 years.  Talk to your health care provider about your screening options, when you should start screening, and how often you should be screened. Colorectal Cancer  Routine colorectal cancer screening usually begins at 58 years of age and should be repeated every 5-10 years until you are 58 years old. You may need to be screened  more often if early forms of precancerous polyps or small growths are found. Your health care provider may recommend screening at an earlier age if you have risk factors for colon cancer.  Your health care provider may recommend using home test kits to check for hidden blood in the stool.  A small camera at the end of a tube can be used to examine your colon (sigmoidoscopy or colonoscopy). This checks for the earliest forms of colorectal cancer. Prostate and Testicular Cancer  Depending on your age and overall health, your health care provider may do certain tests to screen for prostate and testicular cancer.  Talk to your health care provider about any symptoms or concerns you have about testicular or prostate cancer. Skin Cancer  Check your skin from head to toe regularly.  Tell your health care provider about any new moles or changes in moles, especially if: ? There is a change in a mole's size, shape, or color. ? You have a mole that is larger than a pencil eraser.  Always use sunscreen. Apply sunscreen liberally and repeat throughout the day.  Protect yourself by wearing long sleeves, pants, a wide-brimmed hat, and sunglasses when outside. What should I know about heart disease, diabetes, and high blood pressure?  If you are 43-39 years of age, have your blood pressure checked every 3-5 years. If you are 6 years of age or older, have your blood pressure checked every year. You should have your blood pressure measured twice-once when you are at a hospital or clinic, and once when you are not at a hospital or clinic. Record the average of the two measurements. To check your blood pressure when you are not at a hospital or clinic, you can use: ? An automated blood pressure machine at a pharmacy. ? A home blood pressure monitor.  Talk to your health care provider about your target blood pressure.  If you are between 46-60 years old, ask your health care provider if you should take  aspirin to prevent heart disease.  Have regular diabetes screenings by checking your fasting blood sugar level. ? If you are at a normal weight and have a low risk for diabetes, have this test once every three years after the age of 29. ? If you are overweight and have a high risk for diabetes, consider being tested at a younger age or more often.  A one-time screening for abdominal aortic aneurysm (AAA) by ultrasound is recommended for men aged 43-75 years who are current or former smokers. What should I know about preventing infection? Hepatitis B If you have a higher risk for hepatitis B, you should be screened for this virus. Talk with your health care provider to find out if you are at risk for hepatitis B infection. Hepatitis C Blood testing is recommended for:  Everyone born from [birth year] through 1965.  Anyone with known risk factors for hepatitis C. Sexually Transmitted Diseases (STDs)  You should be screened each year for STDs including gonorrhea and chlamydia if: ? You are sexually active and are younger than 58 years of age. ? You are older than 58 years of age and your health care provider tells you that you are at risk for this type of infection. ? Your sexual activity has changed since you were last screened and you are at an increased risk for chlamydia or gonorrhea. Ask your health care provider if you are at risk.  Talk with your health care provider about whether you are at high risk of being infected with HIV. Your health care provider may recommend a prescription medicine to help prevent HIV infection. What else can I do?  Schedule regular health, dental, and eye exams.  Stay current with your vaccines (immunizations).  Do not use any tobacco products, such as cigarettes, chewing tobacco, and e-cigarettes. If you need help quitting, ask your health care provider.  Limit alcohol intake to no more than 2 drinks per day. One drink equals 12 ounces of beer, 5 ounces of  wine, or 1 ounces of hard liquor.  Do not use street drugs.  Do not share needles.  Ask your health care provider for help if you need support or information about quitting drugs.  Tell your health care provider if you often feel depressed.  Tell your health care provider if you have ever been abused or do not feel safe at home. This information is not intended to replace advice given to you by your health care provider. Make sure you discuss any questions you have with your health care provider. Document Released: 06/22/2007 Document Revised: 08/23/2015 Document Reviewed: 09/27/2014 Elsevier Interactive Patient Education  2019 [place] refers to food and lifestyle choices that are based on the traditions of countries located on the [employer]. This way of eating has been shown to help prevent certain conditions and improve outcomes for people who have chronic diseases, like kidney disease and heart disease. What are tips for following this plan? Lifestyle  Cook and eat meals together with your family, when possible.  Drink enough fluid to keep your urine clear or pale yellow.  Be physically active every day. This includes: ? Aerobic exercise like running or swimming. ? Leisure activities like gardening, walking, or housework.  Get 7-8 hours of sleep each night.  If recommended by your health care provider, drink red wine in moderation. This means 1 glass a day for nonpregnant women and 2 glasses a day for men. A glass of wine equals 5 oz (150 mL). Reading food labels   Check the serving size of packaged foods. For foods such as rice and pasta, the serving size refers to the amount of cooked product, not dry.  Check the total fat in packaged foods. Avoid foods that have saturated fat or trans fats.  Check the ingredients list for added sugars, such as corn  syrup. Shopping  At the grocery store, buy most of your food from  the areas near the walls of the store. This includes: ? Fresh fruits and vegetables (produce). ? Grains, beans, nuts, and seeds. Some of these may be available in unpackaged forms or large amounts (in bulk). ? Fresh seafood. ? Poultry and eggs. ? Low-fat dairy products.  Buy whole ingredients instead of prepackaged foods.  Buy fresh fruits and vegetables in-season from local farmers markets.  Buy frozen fruits and vegetables in resealable bags.  If you do not have access to quality fresh seafood, buy precooked frozen shrimp or canned fish, such as tuna, salmon, or sardines.  Buy small amounts of raw or cooked vegetables, salads, or olives from the deli or salad bar at your store.  Stock your pantry so you always have certain foods on hand, such as olive oil, canned tuna, canned tomatoes, rice, pasta, and beans. Cooking  Cook foods with extra-virgin olive oil instead of using butter or other vegetable oils.  Have meat as a side dish, and have vegetables or grains as your main dish. This means having meat in small portions or adding small amounts of meat to foods like pasta or stew.  Use beans or vegetables instead of meat in common dishes like chili or lasagna.  Experiment with different cooking methods. Try roasting or broiling vegetables instead of steaming or sauteing them.  Add frozen vegetables to soups, stews, pasta, or rice.  Add nuts or seeds for added healthy fat at each meal. You can add these to yogurt, salads, or vegetable dishes.  Marinate fish or vegetables using olive oil, lemon juice, garlic, and fresh herbs. Meal planning   Plan to eat 1 vegetarian meal one day each week. Try to work up to 2 vegetarian meals, if possible.  Eat seafood 2 or more times a week.  Have healthy snacks readily available, such as: ? Vegetable sticks with hummus. ? [place] yogurt. ? Fruit and nut trail mix.  Eat balanced meals throughout the week. This includes: ? Fruit: 2-3  servings a day ? Vegetables: 4-5 servings a day ? Low-fat dairy: 2 servings a day ? Fish, poultry, or lean meat: 1 serving a day ? Beans and legumes: 2 or more servings a week ? Nuts and seeds: 1-2 servings a day ? Whole grains: 6-8 servings a day ? Extra-virgin olive oil: 3-4 servings a day  Limit red meat and sweets to only a few servings a month What are my food choices?  Mediterranean diet ? Recommended ? Grains: Whole-grain pasta. Brown rice. Bulgar wheat. Polenta. Couscous. Whole-wheat bread. [place]. ? Vegetables: Artichokes. Beets. Broccoli. Cabbage. Carrots. Eggplant. Green beans. Chard. Kale. Spinach. Onions. Leeks. Peas. Squash. Tomatoes. Peppers. Radishes. ? Fruits: Apples. Apricots. Avocado. Berries. Bananas. Cherries. Dates. Figs. Grapes. Lemons. Melon. Oranges. Peaches. Plums. Pomegranate. ? Meats and other protein foods: Beans. Almonds. Sunflower seeds. Pine nuts. Peanuts. [place]. Salmon. Scallops. Shrimp. [place]. Tilapia. Clams. Oysters. Eggs. ? Dairy: Low-fat milk. Cheese. Greek yogurt. ? Beverages: Water. Red wine. Herbal tea. ? Fats and oils: Extra virgin olive oil. Avocado oil. Grape seed oil. ? Sweets and desserts: [place] yogurt with honey. Baked apples. Poached pears. Trail mix. ? Seasoning and other foods: Basil. Cilantro. Coriander. Cumin. Mint. Parsley. Sage. Rosemary. Tarragon. Garlic. Oregano. Thyme. Pepper. Balsalmic vinegar. Tahini. Hummus. Tomato sauce. Olives. Mushrooms. ? Limit these ? Grains: Prepackaged pasta or rice dishes. Prepackaged cereal with added sugar. ? Vegetables: Deep fried potatoes (french fries). ?  Fruits: Fruit canned in syrup. ? Meats and other protein foods: Beef. Pork. Lamb. Poultry with skin. Hot dogs. [place] Salines. ? Dairy: Ice cream. Sour cream. Whole milk. ? Beverages: Juice. Sugar-sweetened soft drinks. Beer. Liquor and spirits. ? Fats and oils: Butter. Canola oil. Vegetable oil. Beef fat (tallow). Lard. ? Sweets and desserts:  Cookies. Cakes. Pies. Candy. ? Seasoning and other foods: Mayonnaise. Premade sauces and marinades. ? The items listed may not be a complete list. Talk with your dietitian about what dietary choices are right for you. Summary  The Mediterranean diet includes both food and lifestyle choices.  Eat a variety of fresh fruits and vegetables, beans, nuts, seeds, and whole grains.  Limit the amount of red meat and sweets that you eat.  Talk with your health care provider about whether it is safe for you to drink red wine in moderation. This means 1 glass a day for nonpregnant women and 2 glasses a day for men. A glass of wine equals 5 oz (150 mL). This information is not intended to replace advice given to you by your health care provider. Make sure you discuss any questions you have with your health care provider. Document Released: 08/17/2015 Document Revised: 09/19/2015 Document Reviewed: 08/17/2015 Elsevier Interactive Patient Education  2019 [employer].

## 2018-04-23 ENCOUNTER — Encounter: Payer: Managed Care, Other (non HMO) | Admitting: Gastroenterology

## 2018-05-22 ENCOUNTER — Encounter: Payer: Self-pay | Admitting: Family Medicine

## 2018-05-26 ENCOUNTER — Encounter: Payer: Self-pay | Admitting: Gastroenterology

## 2018-05-26 ENCOUNTER — Telehealth: Payer: Self-pay | Admitting: *Deleted

## 2018-05-26 NOTE — Telephone Encounter (Signed)
I did cancel PV and colon as scheduled until pt has an [place]

## 2018-05-26 NOTE — Telephone Encounter (Signed)
Attempted to call pt - Number states no voice mail set up so I could not LM- Sent pt a MY Chart message to call and schedule an OV with Dr Bryan Lemma and that we needed to cancel his colon until after this OV is completed.

## 2018-05-26 NOTE — Telephone Encounter (Signed)
Please schedule for OV with me first to review. Thanks.

## 2018-05-26 NOTE — Telephone Encounter (Signed)
Dr Bryan Lemma,  This pt is scheduled for a direct colon 06-15-2018 Monday for a screening , no GI hx. He saw his pcp 03-18-2018. He mentioned to the pcp that he was having chest pressure and an Echo was ordered but has never been done.   No cardiology referral has been made.  Do we need to get this done before he has a colon or do you want an OV first?  Please advise, Thanks Lelan Pons

## 2018-05-27 NOTE — Telephone Encounter (Signed)
Patient called back and I have scheduled the patient for a in office visit with Dr. Bryan Lemma for tomorrow 5-21

## 2018-05-27 NOTE — Telephone Encounter (Signed)
Attempted to call pt this morning- his phone went straight to VM with a MB that was not set up so could not LM.

## 2018-05-28 ENCOUNTER — Ambulatory Visit: Payer: Managed Care, Other (non HMO) | Admitting: Gastroenterology

## 2018-05-28 ENCOUNTER — Other Ambulatory Visit: Payer: Self-pay

## 2018-05-28 ENCOUNTER — Encounter: Payer: Self-pay | Admitting: Gastroenterology

## 2018-05-28 VITALS — BP 142/80 | HR 57 | Temp 98.2°F | Ht 66.25 in | Wt 216.1 lb

## 2018-05-28 DIAGNOSIS — Z1211 Encounter for screening for malignant neoplasm of colon: Secondary | ICD-10-CM

## 2018-05-28 DIAGNOSIS — Z8249 Family history of ischemic heart disease and other diseases of the circulatory system: Secondary | ICD-10-CM

## 2018-05-28 DIAGNOSIS — Z1212 Encounter for screening for malignant neoplasm of rectum: Secondary | ICD-10-CM

## 2018-05-28 DIAGNOSIS — R0789 Other chest pain: Secondary | ICD-10-CM | POA: Diagnosis not present

## 2018-05-28 NOTE — Progress Notes (Signed)
Chief Complaint: CRC screening  Referring Provider:     Libby Maw, MD   HPI:    Christopher Joyce is a 58 y.o. male with a history of hyperlipidemia referred to the [facility] for routine, age appropriate CRC screening.  No previous CRC screening.  Otherwise without GI symptoms to include hematochezia, melena, abdominal pain, changes in bowel habits, and no fever, chills, nausea, vomiting.  Weight is stable.  Of note, when seeing his PCM, Dr. Ethelene Joyce, on 3/11 to establish care, he endorsed intermittent chest pressure without SOB or DOE. No radiation. No provoking/allevating factors. Significant family history of CAD with life-ending MIs in his 78 year old father and 16 year old brother.  He is a former smoker (quit 11 years ago) but otherwise maintains active lifestyle and healthy diet.  Was referred for echocardiogram stress test, which has not yet been completed due to COVID-19 related restrictions.  Today, he states he feels well and w/o any complaints. Chest pressure has since resolved. Was present intermittently for approx 2 weeks. No recurrence.  Still maintaining active lifestyle with no restrictions.  Recent normal CMP and CBC.  Elevated LDL.  Past Medical History:  Diagnosis Date  . Chicken pox   . Headache(784.0)   . Hyperlipidemia      Past Surgical History:  Procedure Laterality Date  . WISDOM TOOTH EXTRACTION     1 tooth taken out. Was done about 5 years ago   Family History  Problem Relation Age of Onset  . Diabetes Mother   . Stroke Mother   . Hyperlipidemia Father   . Heart attack Father 58  . Early death Father   . Heart disease Father   . Heart attack Brother 70  . Hyperlipidemia Brother   . Early death Sister   . Drug abuse Sister   . Diabetes Brother   . Stroke Paternal Grandfather   . Heart attack Paternal Grandfather   . Diabetes Maternal Grandmother   . Cancer Neg Hx   . Hypertension Neg Hx   . Kidney disease  Neg Hx   . Colon cancer Neg Hx   . Esophageal cancer Neg Hx    Social History   Tobacco Use  . Smoking status: Former Research scientist (life sciences)  . Smokeless tobacco: Never Used  . Tobacco comment: quit smoking 2008  Substance Use Topics  . Alcohol use: No  . Drug use: No   Current Outpatient Medications  Medication Sig Dispense Refill  . aspirin 81 MG tablet Take 81 mg by mouth daily.    . Omega-3 Fatty Acids (FISH OIL) 1200 MG CAPS Take 1 capsule by mouth daily.     . TURMERIC PO Take 300 mg by mouth daily.     No current facility-administered medications for this visit.    No Known Allergies   Review of Systems: All systems reviewed and negative except where noted in HPI.     Physical Exam:    Wt Readings from Last 3 Encounters:  05/28/18 216 lb 2 oz (98 kg)  03/18/18 219 lb (99.3 kg)  10/12/14 212 lb (96.2 kg)    Ht 5' 6.25" (1.683 m)   Wt 216 lb 2 oz (98 kg)   BMI 34.62 kg/m  Constitutional:  Pleasant, in no acute distress. Psychiatric: Normal mood and affect. Behavior is normal. EENT: Pupils normal.  Conjunctivae are normal. No scleral icterus. Neck supple. No cervical LAD. Cardiovascular: Normal rate, regular rhythm.  No edema Pulmonary/chest: Effort normal and breath sounds normal. No wheezing, rales or rhonchi. Abdominal: Soft, nondistended, nontender. Bowel sounds active throughout. There are no masses palpable. No hepatomegaly. Neurological: Alert and oriented to person place and time. Skin: Skin is warm and dry. No rashes noted.   ASSESSMENT AND PLAN;   1) CRC Screening: Al Tippin is a 58 y.o. male presenting to the [facility] for initial CRC screening. No family history of CRC or related malignancies, and patient is without any active GI sxs. No previous CRC screening to date. Discussed options for CRC screening, to include optical vs virtual colonoscopy - the risks and benefits and pros and cons of each, as well as discussion of FIT kit testing,  Cologuard, etc, and the patient decided to proceed with an optical colonoscopy.   - Will schedule date and time for colonoscopy pending completion of cardiac work-up as below - NPO at [facility] prior to procedure  - Bowel prep ordered with plan for instruction with GI clinical staff - All questions answered  2) Left chest pressure: 3) Family history of CAD: 2-week history of left sided chest pressure in 03/2018 without additional symptoms or radiation.  Symptoms have since resolved.  Strong family history of CAD and early cardiac death (father and brother).  Stress echo ordered by his PCM, but has been unable to get done due to COVID-19 related restrictions.  - Obtain stress echo as previously ordered by Dr. Ethelene Joyce - Provided echo is okay, can then proceed with scheduling elective colonoscopy above - Will send message to his PCM to confirm   The indications, risks, and benefits of colonoscopy were explained to the patient in detail. Risks include but are not limited to bleeding, perforation, adverse reaction to medications, and cardiopulmonary compromise. Sequelae include but are not limited to the possibility of surgery, hospitalization, and mortality. The patient verbalized understanding and wished to proceed. All questions answered, referred for scheduling and bowel prep ordered. Further recommendations pending results of the exam.      Lavena Bullion, DO, FACG  05/28/2018, 2:59 PM   Christopher Joyce Mortimer Fries,*

## 2018-05-28 NOTE — Patient Instructions (Signed)
If you are age 58 or older, your body mass index should be between 23-30. Your Body mass index is 34.62 kg/m. If this is out of the aforementioned range listed, please consider follow up with your Primary Care Provider.  If you are age 91 or younger, your body mass index should be between 19-25. Your Body mass index is 34.62 kg/m. If this is out of the aformentioned range listed, please consider follow up with your Primary Care Provider.   To help prevent the possible spread of infection to our patients, communities, and staff; we will be implementing the following measures:  As of now we are not allowing any visitors/family members to accompany you to any upcoming appointments with [facility]. If you have any concerns about this please contact our office to discuss prior to the appointment.   Please call our office at [phone] to set up your Colonoscopy after your Echocardiogram.   It was a pleasure to see you today!  Vito Cirigliano, D.O.

## 2018-06-04 ENCOUNTER — Encounter: Payer: Self-pay | Admitting: Family Medicine

## 2018-06-15 ENCOUNTER — Encounter: Payer: Managed Care, Other (non HMO) | Admitting: Gastroenterology

## 2018-06-18 ENCOUNTER — Ambulatory Visit: Payer: Managed Care, Other (non HMO) | Admitting: Family Medicine

## 2018-07-08 ENCOUNTER — Telehealth: Payer: Self-pay

## 2018-07-08 NOTE — Telephone Encounter (Signed)
Can we see if we can get his stress test scheduled?

## 2018-07-09 NOTE — Telephone Encounter (Signed)
Stress test is on hold right now. But they will call him when it become available. I think due to covid.

## 2018-09-21 ENCOUNTER — Telehealth: Payer: Self-pay

## 2018-09-21 NOTE — Telephone Encounter (Signed)
Patient is overdue for follow up of his Ldl cholesterol. We can discuss Hep C screening at that time. Please have him see me.

## 2018-09-21 NOTE — Telephone Encounter (Signed)
Can we see if we can schedule pt to see Dr. Ethelene Hal tomorrow morning at 11? He is suppose to come for a flu shot at 10:40, I can give him the flu shot during the appointment.

## 2018-09-21 NOTE — Telephone Encounter (Signed)
Okay to order Hep C screening for pt?     Copied from [place] [id number]. Topic: General - Other >> Sep 21, 2018  8:31 AM Rainey Pines A wrote: Patient would like to speak with a nurse in regards to hepatitis c screening that was recommended to him.

## 2018-09-22 ENCOUNTER — Encounter: Payer: Self-pay | Admitting: Family Medicine

## 2018-09-22 ENCOUNTER — Encounter: Payer: Self-pay | Admitting: Gastroenterology

## 2018-09-22 ENCOUNTER — Ambulatory Visit: Payer: Managed Care, Other (non HMO)

## 2018-09-22 ENCOUNTER — Ambulatory Visit: Payer: Managed Care, Other (non HMO) | Admitting: Family Medicine

## 2018-09-22 ENCOUNTER — Telehealth: Payer: Self-pay | Admitting: *Deleted

## 2018-09-22 ENCOUNTER — Other Ambulatory Visit: Payer: Self-pay

## 2018-09-22 VITALS — BP 118/80 | HR 76 | Ht 66.25 in | Wt 225.1 lb

## 2018-09-22 DIAGNOSIS — E7800 Pure hypercholesterolemia, unspecified: Secondary | ICD-10-CM

## 2018-09-22 DIAGNOSIS — Z23 Encounter for immunization: Secondary | ICD-10-CM | POA: Diagnosis not present

## 2018-09-22 DIAGNOSIS — Z1159 Encounter for screening for other viral diseases: Secondary | ICD-10-CM

## 2018-09-22 DIAGNOSIS — Z1211 Encounter for screening for malignant neoplasm of colon: Secondary | ICD-10-CM | POA: Diagnosis not present

## 2018-09-22 LAB — COMPREHENSIVE METABOLIC PANEL
ALT: 23 U/L (ref 0–53)
AST: 35 U/L (ref 0–37)
Albumin: 4.3 g/dL (ref 3.5–5.2)
Alkaline Phosphatase: 32 U/L — ABNORMAL LOW (ref 39–117)
BUN: 12 mg/dL (ref 6–23)
CO2: 27 mEq/L (ref 19–32)
Calcium: 9.8 mg/dL (ref 8.4–10.5)
Chloride: 104 mEq/L (ref 96–112)
Creatinine, Ser: 1.04 mg/dL (ref 0.40–1.50)
GFR: 88.74 mL/min (ref >60.00)
Glucose, Bld: 94 mg/dL (ref 70–99)
Potassium: 3.9 mEq/L (ref 3.5–5.1)
Sodium: 139 mEq/L (ref 135–145)
Total Bilirubin: 0.4 mg/dL (ref 0.2–1.2)
Total Protein: 7.5 g/dL (ref 6.0–8.3)

## 2018-09-22 LAB — CBC
HCT: 43.1 % (ref 39.0–52.0)
Hemoglobin: 14.4 g/dL (ref 13.0–17.0)
MCHC: 33.4 g/dL (ref 30.0–36.0)
MCV: 89.9 fl (ref 78.0–100.0)
Platelets: 221 10*3/uL (ref 150.0–400.0)
RBC: 4.8 Mil/uL (ref 4.22–5.81)
RDW: 14.4 % (ref 11.5–15.5)
WBC: 4.4 10*3/uL (ref 4.0–10.5)

## 2018-09-22 LAB — LIPID PANEL
Cholesterol: 204 mg/dL — ABNORMAL HIGH (ref 0–200)
HDL: 37.7 mg/dL — ABNORMAL LOW (ref >39.00)
LDL Cholesterol: 152 mg/dL — ABNORMAL HIGH (ref 0–99)
NonHDL: 165.85
Total CHOL/HDL Ratio: 5
Triglycerides: 70 mg/dL (ref 0.0–149.0)
VLDL: 14 mg/dL (ref 0.0–40.0)

## 2018-09-22 LAB — LDL CHOLESTEROL, DIRECT: Direct LDL: 163 mg/dL

## 2018-09-22 NOTE — Telephone Encounter (Signed)
I reviewed the notes by Dr. Ethelene Hal.  No plan to proceed with further cardiology testing given resolution of his symptoms.  Given those plans, feel he is likely optimized medically and can proceed with colonoscopy.  Christopher Joyce, Wanted to get your input on scheduling of Mr. Cooperman.  Thanks.

## 2018-09-22 NOTE — Patient Instructions (Signed)
Preventing High Cholesterol Cholesterol is a white, waxy substance similar to fat that the human body needs to help build cells. The liver makes all the cholesterol that a person's body needs. Having high cholesterol (hypercholesterolemia) increases a person's risk for heart disease and stroke. Extra (excess) cholesterol comes from the food the person eats. High cholesterol can often be prevented with diet and lifestyle changes. If you already have high cholesterol, you can control it with diet and lifestyle changes and with medicine. How can high cholesterol affect me? If you have high cholesterol, deposits (plaques) may build up on the walls of your arteries. The arteries are the blood vessels that carry blood away from your heart. Plaques make the arteries narrower and stiffer. This can limit or block blood flow and cause blood clots to form. Blood clots:  Are tiny balls of cells that form in your blood.  Can move to the heart or brain, causing a heart attack or stroke. Plaques in arteries greatly increase your risk for heart attack and stroke.Making diet and lifestyle changes can reduce your risk for these conditions that may threaten your life. What can increase my risk? This condition is more likely to develop in people who:  Eat foods that are high in saturated fat or cholesterol. Saturated fat is mostly found in: ? Foods that contain animal fat, such as red meat and some dairy products. ? Certain fatty foods made from plants, such as tropical oils.  Are overweight.  Are not getting enough exercise.  Have a family history of high cholesterol. What actions can I take to prevent this? Nutrition   Eat less saturated fat.  Avoid trans fats (partially hydrogenated oils). These are often found in margarine and in some baked goods, fried foods, and snacks bought in packages.  Avoid precooked or cured meat, such as sausages or meat loaves.  Avoid foods and drinks that have added  sugars.  Eat more fruits, vegetables, and whole grains.  Choose healthy sources of protein, such as fish, poultry, lean cuts of red meat, beans, peas, lentils, and nuts.  Choose healthy sources of fat, such as: ? Nuts. ? Vegetable oils, especially olive oil. ? Fish that have healthy fats (omega-3 fatty acids), such as mackerel or salmon. The items listed above may not be a complete list of recommended foods and beverages. Contact a dietitian for more information. Lifestyle  Lose weight if you are overweight. Losing 5-10 lb (2.3-4.5 kg) can help prevent or control high cholesterol. It can also lower your risk for diabetes and high blood pressure. Ask your health care provider to help you with a diet and exercise plan to lose weight safely.  Do not use any products that contain nicotine or tobacco, such as cigarettes, e-cigarettes, and chewing tobacco. If you need help quitting, ask your health care provider.  Limit your alcohol intake. ? Do not drink alcohol if:  Your health care provider tells you not to drink.  You are pregnant, may be pregnant, or are planning to become pregnant. ? If you drink alcohol:  Limit how much you use to:  0-1 drink a day for women.  0-2 drinks a day for men.  Be aware of how much alcohol is in your drink. In the [place] one drink equals one 12 oz bottle of beer (355 mL), one 5 oz glass of wine (148 mL), or one 1 oz glass of hard liquor (44 mL). Activity   Get enough exercise. Each week, do at   least 150 minutes of exercise that takes a medium level of effort (moderate-intensity exercise). ? This is exercise that:  Makes your heart beat faster and makes you breathe harder than usual.  Allows you to still be able to talk. ? You could exercise in short sessions several times a day or longer sessions a few times a week. For example, on 5 days each week, you could walk fast or ride your bike 3 times a day for 10 minutes each time.  Do exercises as told  by your health care provider. Medicines  In addition to diet and lifestyle changes, your health care provider may recommend medicines to help lower cholesterol. This may be a medicine to lower the amount of cholesterol your liver makes. You may need medicine if: ? Diet and lifestyle changes do not lower your cholesterol enough. ? You have high cholesterol and other risk factors for heart disease or stroke.  Take over-the-counter and prescription medicines only as told by your health care provider. General information  Manage your risk factors for high cholesterol. Talk with your health care provider about all your risk factors and how to lower your risk.  Manage other conditions that you have, such as diabetes or high blood pressure (hypertension).  Have blood tests to check your cholesterol levels at regular points in time as told by your health care provider.  Keep all follow-up visits as told by your health care provider. This is important. Where to find more information  American Heart Association: www.heart.org  National Heart, Lung, and Blood Institute: https://wilson-eaton.com/ Summary  High cholesterol increases your risk for heart disease and stroke. By keeping your cholesterol level low, you can reduce your risk for these conditions.  High cholesterol can often be prevented with diet and lifestyle changes.  Work with your health care provider to manage your risk factors, and have your blood tested regularly. This information is not intended to replace advice given to you by your health care provider. Make sure you discuss any questions you have with your health care provider. Document Released: 01/08/2015 Document Revised: 04/17/2018 Document Reviewed: 09/02/2015 Elsevier Patient Education  2020 [employer].  Exercising to Lose Weight Exercise is structured, repetitive physical activity to improve fitness and health. Getting regular exercise is important for everyone. It is  especially important if you are overweight. Being overweight increases your risk of heart disease, stroke, diabetes, high blood pressure, and several types of cancer. Reducing your calorie intake and exercising can help you lose weight. Exercise is usually categorized as moderate or vigorous intensity. To lose weight, most people need to do a certain amount of moderate-intensity or vigorous-intensity exercise each week. Moderate-intensity exercise  Moderate-intensity exercise is any activity that gets you moving enough to burn at least three times more energy (calories) than if you were sitting. Examples of moderate exercise include:  Walking a mile in 15 minutes.  Doing light yard work.  Biking at an easy pace. Most people should get at least 150 minutes (2 hours and 30 minutes) a week of moderate-intensity exercise to maintain their body weight. Vigorous-intensity exercise Vigorous-intensity exercise is any activity that gets you moving enough to burn at least six times more calories than if you were sitting. When you exercise at this intensity, you should be working hard enough that you are not able to carry on a conversation. Examples of vigorous exercise include:  Running.  Playing a team sport, such as football, basketball, and soccer.  Jumping rope. Most  people should get at least 75 minutes (1 hour and 15 minutes) a week of vigorous-intensity exercise to maintain their body weight. How can exercise affect me? When you exercise enough to burn more calories than you eat, you lose weight. Exercise also reduces body fat and builds muscle. The more muscle you have, the more calories you burn. Exercise also:  Improves mood.  Reduces stress and tension.  Improves your overall fitness, flexibility, and endurance.  Increases bone strength. The amount of exercise you need to lose weight depends on:  Your age.  The type of exercise.  Any health conditions you have.  Your overall  physical ability. Talk to your health care provider about how much exercise you need and what types of activities are safe for you. What actions can I take to lose weight? Nutrition   Make changes to your diet as told by your health care provider or diet and nutrition specialist (dietitian). This may include: ? Eating fewer calories. ? Eating more protein. ? Eating less unhealthy fats. ? Eating a diet that includes fresh fruits and vegetables, whole grains, low-fat dairy products, and lean protein. ? Avoiding foods with added fat, salt, and sugar.  Drink plenty of water while you exercise to prevent dehydration or heat stroke. Activity  Choose an activity that you enjoy and set realistic goals. Your health care provider can help you make an exercise plan that works for you.  Exercise at a moderate or vigorous intensity most days of the week. ? The intensity of exercise may vary from person to person. You can tell how intense a workout is for you by paying attention to your breathing and heartbeat. Most people will notice their breathing and heartbeat get faster with more intense exercise.  Do resistance training twice each week, such as: ? Push-ups. ? Sit-ups. ? Lifting weights. ? Using resistance bands.  Getting short amounts of exercise can be just as helpful as long structured periods of exercise. If you have trouble finding time to exercise, try to include exercise in your daily routine. ? Get up, stretch, and walk around every 30 minutes throughout the day. ? Go for a walk during your lunch break. ? Park your car farther away from your destination. ? If you take public transportation, get off one stop early and walk the rest of the way. ? Make phone calls while standing up and walking around. ? Take the stairs instead of elevators or escalators.  Wear comfortable clothes and shoes with good support.  Do not exercise so much that you hurt yourself, feel dizzy, or get very  short of breath. Where to find more information  [place] Department of Health and Human Services: BondedCompany.at  Centers for Disease Control and Prevention (CDC): http://www.wolf.info/ Contact a health care provider:  Before starting a new exercise program.  If you have questions or concerns about your weight.  If you have a medical problem that keeps you from exercising. Get help right away if you have any of the following while exercising:  Injury.  Dizziness.  Difficulty breathing or shortness of breath that does not go away when you stop exercising.  Chest pain.  Rapid heartbeat. Summary  Being overweight increases your risk of heart disease, stroke, diabetes, high blood pressure, and several types of cancer.  Losing weight happens when you burn more calories than you eat.  Reducing the amount of calories you eat in addition to getting regular moderate or vigorous exercise each week helps you  lose weight. This information is not intended to replace advice given to you by your health care provider. Make sure you discuss any questions you have with your health care provider. Document Released: 01/26/2010 Document Revised: 01/06/2017 Document Reviewed: 01/06/2017 Elsevier Patient Education  2020 [employer].

## 2018-09-22 NOTE — Telephone Encounter (Signed)
Dr Bryan Lemma,   [place] saw this patient in May in the Office- he was scheduled for a colon but had a pending Echo- in your note you states proceed with colon AFTER echo completed- The pt is back on the Schedule for a colon but never had the Echo done-  He told me he saw his PCP today and they discussed the chest pressure he had back in May and think it was contributed to the Fish Oil he was taking as since he stopped it, all the chest pressure has subsided and he has no issues. The PCP thought the Echo is now not needed.  Please advise. I told Mr Aldi he may still need the Echo to proceed with the colon.  Thanks,Marie

## 2018-09-22 NOTE — Telephone Encounter (Signed)
Tried to call pt but had to leave a message, will ask when he comes in for appt for flu shot

## 2018-09-22 NOTE — Progress Notes (Addendum)
Established Patient Office Visit  Subjective:  Patient ID: Christopher Joyce, male    DOB: 1960/06/23  Age: 58 y.o. MRN: [id number]  CC:  Chief Complaint  Patient presents with  . Follow-up    HPI Christopher Joyce presents for a flu shot, hep C screen and follow-up of his elevated LDL cholesterol.  He saw that hep C had been recommended in my chart and wants to have that screening done.  He is also ready to go for his colonoscopy.  The chest pressure he had been experiencing has resolved since he stopped taking flaxseed oil capsules.  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  He has gained some weight.  Past Medical History:  Diagnosis Date  . Chicken pox   . Headache(784.0)   . Hyperlipidemia     Past Surgical History:  Procedure Laterality Date  . WISDOM TOOTH EXTRACTION     1 tooth taken out. Was done about 5 years ago    Family History  Problem Relation Age of Onset  . Diabetes Mother   . Stroke Mother   . Hyperlipidemia Father   . Heart attack Father 69  . Early death Father   . Heart disease Father   . Heart attack Brother 17  . Hyperlipidemia Brother   . Early death Sister   . Drug abuse Sister   . Diabetes Brother   . Stroke Paternal Grandfather   . Heart attack Paternal Grandfather   . Diabetes Maternal Grandmother   . Cancer Neg Hx   . Hypertension Neg Hx   . Kidney disease Neg Hx   . Colon cancer Neg Hx   . Esophageal cancer Neg Hx     Social History   Socioeconomic History  . Marital status: Divorced    Spouse name: Not on file  . Number of children: 5  . Years of education: Assoc.5  . Highest education level: Not on file  Occupational History  . Occupation: Corporate treasurer    Comment: Chartered certified accountant  Social Needs  . Financial resource strain: Not on file  . Food insecurity    Worry: Not on file    Inability: Not on file  . Transportation needs    Medical: Not on file    Non-medical: Not on file   Tobacco Use  . Smoking status: Former Research scientist (life sciences)  . Smokeless tobacco: Never Used  . Tobacco comment: quit smoking 2008  Substance and Sexual Activity  . Alcohol use: No  . Drug use: No  . Sexual activity: Yes  Lifestyle  . Physical activity    Days per week: Not on file    Minutes per session: Not on file  . Stress: Not on file  Relationships  . Social Herbalist on phone: Not on file    Gets together: Not on file    Attends religious service: Not on file    Active member of club or organization: Not on file    Attends meetings of clubs or organizations: Not on file    Relationship status: Not on file  . Intimate partner violence    Fear of current or ex partner: Not on file    Emotionally abused: Not on file    Physically abused: Not on file    Forced sexual activity: Not on file  Other Topics Concern  . Not on file  Social History Narrative   Patient lives at home with his  mom Christopher Joyce.    Patient has 5 children.    Patient is Divorced.    Patient is working full-time.    Patient is right-handed    Outpatient Medications Prior to Visit  Medication Sig Dispense Refill  . aspirin 81 MG tablet Take 81 mg by mouth daily.    . Omega-3 Fatty Acids (FISH OIL) 1200 MG CAPS Take 1 capsule by mouth daily.     . TURMERIC PO Take 300 mg by mouth daily.     No facility-administered medications prior to visit.     No Known Allergies  ROS Review of Systems  Constitutional: Negative.   HENT: Negative.   Eyes: Negative for photophobia and visual disturbance.  Respiratory: Negative.  Negative for chest tightness, shortness of breath and wheezing.   Cardiovascular: Negative.  Negative for chest pain and palpitations.  Gastrointestinal: Negative.   Endocrine: Negative for polyphagia and polyuria.  Genitourinary: Negative.   Musculoskeletal: Negative for gait problem and joint swelling.  Allergic/Immunologic: Negative for immunocompromised state.  Neurological: Negative  for light-headedness and headaches.  Hematological: Does not bruise/bleed easily.  Psychiatric/Behavioral: Negative.       Objective:    Physical Exam  Constitutional: He is oriented to person, place, and time. He appears well-developed and well-nourished. No distress.  HENT:  Head: Normocephalic and atraumatic.  Right Ear: External ear normal.  Left Ear: External ear normal.  Mouth/Throat: Oropharynx is clear and moist. No oropharyngeal exudate.  Eyes: Pupils are equal, round, and reactive to light. Conjunctivae are normal. Right eye exhibits no discharge. Left eye exhibits no discharge. No scleral icterus.  Neck: Neck supple. No JVD present. No tracheal deviation present. No thyromegaly present.  Cardiovascular: Normal rate, regular rhythm and normal heart sounds.  Pulmonary/Chest: Effort normal and breath sounds normal. No stridor.  Abdominal: Bowel sounds are normal.  Musculoskeletal:        General: No edema.  Lymphadenopathy:    He has no cervical adenopathy.  Neurological: He is alert and oriented to person, place, and time.  Skin: Skin is warm and dry. He is not diaphoretic.  Psychiatric: He has a normal mood and affect. His behavior is normal.  The 10-year ASCVD risk score Mikey Bussing DC Jr., et al., 2013) is: 7.2%   Values used to calculate the score:     Age: 42 years     Sex: Male     Is Non-Hispanic African American: Yes     Diabetic: No     Tobacco smoker: No     Systolic Blood Pressure: 123456 mmHg     Is BP treated: No     HDL Cholesterol: 37.7 mg/dL     Total Cholesterol: 204 mg/dL  BP 118/80   Pulse 76   Ht 5' 6.25" (1.683 m)   Wt 225 lb 2 oz (102.1 kg)   SpO2 98%   BMI 36.06 kg/m  Wt Readings from Last 3 Encounters:  09/22/18 225 lb 2 oz (102.1 kg)  05/28/18 216 lb 2 oz (98 kg)  03/18/18 219 lb (99.3 kg)   BP Readings from Last 3 Encounters:  09/22/18 118/80  05/28/18 (!) 142/80  03/18/18 132/80   Guideline developer:  UpToDate (see UpToDate for  funding source) Date Released: June 2014  Health Maintenance Due  Topic Date Due  . Hepatitis C Screening  Mar 11, 1960  . HIV Screening  09/03/1975  . COLONOSCOPY  09/03/2010    There are no preventive care reminders to display for this patient.  Lab Results  Component Value Date   TSH 1.00 08/19/2012   Lab Results  Component Value Date   WBC 4.6 03/18/2018   HGB 16.0 03/18/2018   HCT 47.6 03/18/2018   MCV 90.7 03/18/2018   PLT 235.0 03/18/2018   Lab Results  Component Value Date   NA 136 03/18/2018   K 4.6 03/18/2018   CO2 28 03/18/2018   GLUCOSE 85 03/18/2018   BUN 11 03/18/2018   CREATININE 1.11 03/18/2018   BILITOT 0.6 03/18/2018   ALKPHOS 37 (L) 03/18/2018   AST 31 03/18/2018   ALT 21 03/18/2018   PROT 7.8 03/18/2018   ALBUMIN 4.7 03/18/2018   CALCIUM 10.1 03/18/2018   GFR 82.46 03/18/2018   Lab Results  Component Value Date   CHOL 228 (H) 03/18/2018   Lab Results  Component Value Date   HDL 37.20 (L) 03/18/2018   Lab Results  Component Value Date   LDLCALC 167 (H) 03/18/2018   Lab Results  Component Value Date   TRIG 117.0 03/18/2018   Lab Results  Component Value Date   CHOLHDL 6 03/18/2018   No results found for: HGBA1C    Assessment & Plan:   Problem List Items Addressed This Visit      Other   Screen for colon cancer   Relevant Orders   Ambulatory referral to Gastroenterology   Elevated LDL cholesterol level   Relevant Orders   CBC   Comprehensive metabolic panel   LDL cholesterol, direct   Lipid panel   Need for influenza vaccination - Primary   Relevant Orders   Flu Vaccine QUAD 36+ mos IM (Completed)      No orders of the defined types were placed in this encounter.   Follow-up: Return in about 6 months (around 03/22/2019).   Patient was given information on preventing high cholesterol and exercising to lose weight.  He will lower the fat and cholesterol in his diet and follow-up with me in 6 months agrees to go for  screening colonoscopy.

## 2018-09-23 LAB — HEPATITIS C ANTIBODY
Hepatitis C Ab: NONREACTIVE
SIGNAL TO CUT-OFF: 0.02 (ref <1.00)

## 2018-09-23 NOTE — Telephone Encounter (Signed)
Dr. Cirigliano,  This pt is cleared for anesthetic care at [facility].   Thanks,  Zellie Jenning 

## 2018-09-23 NOTE — Telephone Encounter (Signed)
Called pt. No answer, left message to let him know that he is cleared to have the colonoscopy as scheduled.

## 2018-10-02 ENCOUNTER — Other Ambulatory Visit: Payer: Self-pay

## 2018-10-02 ENCOUNTER — Ambulatory Visit (AMBULATORY_SURGERY_CENTER): Payer: Self-pay | Admitting: *Deleted

## 2018-10-02 VITALS — Temp 97.4°F | Ht 66.25 in | Wt 226.0 lb

## 2018-10-02 DIAGNOSIS — Z1211 Encounter for screening for malignant neoplasm of colon: Secondary | ICD-10-CM

## 2018-10-02 MED ORDER — NA SULFATE-K SULFATE-MG SULF 17.5-3.13-1.6 GM/177ML PO SOLN: 1.0000 | Freq: Once | ORAL | 0 refills | Status: AC

## 2018-10-02 NOTE — Progress Notes (Signed)
No egg or soy allergy known to patient  No issues with past sedation with any surgeries  or procedures, no intubation problems  No diet pills per patient No home 02 use per patient  No blood thinners per patient  Pt denies issues with constipation  No A fib or A flutter  EMMI video sent to pt's e mail   Due to the COVID-19 pandemic we are asking patients to follow these guidelines. Please only bring one care partner. Please be aware that your care partner may wait in the car in the parking lot or if they feel like they will be too hot to wait in the car, they may wait in the lobby on the 4th floor. All care partners are required to wear a mask the entire time (we do not have any that we can provide them), they need to practice social distancing, and we will do a Covid check for all patient's and care partners when you arrive. Also we will check their temperature and your temperature. If the care partner waits in their car they need to stay in the parking lot the entire time and we will call them on their cell phone when the patient is ready for discharge so they can bring the car to the front of the building. Also all patient's will need to wear a mask into building.  suprep coupon provided

## 2018-10-07 ENCOUNTER — Encounter: Payer: Self-pay | Admitting: Gastroenterology

## 2018-10-16 ENCOUNTER — Encounter: Payer: Managed Care, Other (non HMO) | Admitting: Gastroenterology

## 2018-10-19 ENCOUNTER — Telehealth: Payer: Self-pay

## 2018-10-19 NOTE — Telephone Encounter (Signed)
Pt responded "no" to all screening questions °

## 2018-10-19 NOTE — Telephone Encounter (Signed)
Covid-19 screening questions   Do you now or have you had a fever in the last 14 days?  Do you have any respiratory symptoms of shortness of breath or cough now or in the last 14 days?  Do you have any family members or close contacts with diagnosed or suspected Covid-19 in the past 14 days?  Have you been tested for Covid-19 and found to be positive?       

## 2018-10-20 ENCOUNTER — Other Ambulatory Visit: Payer: Self-pay

## 2018-10-20 ENCOUNTER — Ambulatory Visit (AMBULATORY_SURGERY_CENTER): Payer: Managed Care, Other (non HMO) | Admitting: Gastroenterology

## 2018-10-20 ENCOUNTER — Encounter: Payer: Self-pay | Admitting: Gastroenterology

## 2018-10-20 ENCOUNTER — Other Ambulatory Visit: Payer: Self-pay | Admitting: Gastroenterology

## 2018-10-20 VITALS — BP 120/70 | HR 64 | Temp 98.8°F | Resp 11 | Ht 66.0 in | Wt 226.0 lb

## 2018-10-20 DIAGNOSIS — D122 Benign neoplasm of ascending colon: Secondary | ICD-10-CM | POA: Diagnosis not present

## 2018-10-20 DIAGNOSIS — D125 Benign neoplasm of sigmoid colon: Secondary | ICD-10-CM | POA: Diagnosis not present

## 2018-10-20 DIAGNOSIS — K6389 Other specified diseases of intestine: Secondary | ICD-10-CM

## 2018-10-20 DIAGNOSIS — Z1211 Encounter for screening for malignant neoplasm of colon: Secondary | ICD-10-CM

## 2018-10-20 MED ORDER — SODIUM CHLORIDE 0.9 % IV SOLN: 500.0000 mL | Freq: Once | INTRAVENOUS | Status: DC

## 2018-10-20 NOTE — Progress Notes (Signed)
Temperature taken by J.B., VS taken by C.W. 

## 2018-10-20 NOTE — Progress Notes (Signed)
Pt's states no medical or surgical changes since previsit or office visit. 

## 2018-10-20 NOTE — Patient Instructions (Signed)
Handout for polyps given.  YOU HAD AN ENDOSCOPIC PROCEDURE TODAY AT THE  ENDOSCOPY CENTER:   Refer to the procedure report that was given to you for any specific questions about what was found during the examination.  If the procedure report does not answer your questions, please call your gastroenterologist to clarify.  If you requested that your care partner not be given the details of your procedure findings, then the procedure report has been included in a sealed envelope for you to review at your convenience later.  YOU SHOULD EXPECT: Some feelings of bloating in the abdomen. Passage of more gas than usual.  Walking can help get rid of the air that was put into your GI tract during the procedure and reduce the bloating. If you had a lower endoscopy (such as a colonoscopy or flexible sigmoidoscopy) you may notice spotting of blood in your stool or on the toilet paper. If you underwent a bowel prep for your procedure, you may not have a normal bowel movement for a few days.  Please Note:  You might notice some irritation and congestion in your nose or some drainage.  This is from the oxygen used during your procedure.  There is no need for concern and it should clear up in a day or so.  SYMPTOMS TO REPORT IMMEDIATELY:   Following lower endoscopy (colonoscopy or flexible sigmoidoscopy):  Excessive amounts of blood in the stool  Significant tenderness or worsening of abdominal pains  Swelling of the abdomen that is new, acute  Fever of 100F or higher  For urgent or emergent issues, a gastroenterologist can be reached at any hour by calling [phone].   DIET:  We do recommend a small meal at first, but then you may proceed to your regular diet.  Drink plenty of fluids but you should avoid alcoholic beverages for 24 hours.  ACTIVITY:  You should plan to take it easy for the rest of today and you should NOT DRIVE or use heavy machinery until tomorrow (because of the sedation  medicines used during the test).    FOLLOW UP: Our staff will call the number listed on your records 48-72 hours following your procedure to check on you and address any questions or concerns that you may have regarding the information given to you following your procedure. If we do not reach you, we will leave a message.  We will attempt to reach you two times.  During this call, we will ask if you have developed any symptoms of COVID 19. If you develop any symptoms (ie: fever, flu-like symptoms, shortness of breath, cough etc.) before then, please call [phone].  If you test positive for Covid 19 in the 2 weeks post procedure, please call and report this information to us.    If any biopsies were taken you will be contacted by phone or by letter within the next 1-3 weeks.  Please call us at [phone] if you have not heard about the biopsies in 3 weeks.    SIGNATURES/CONFIDENTIALITY: You and/or your care partner have signed paperwork which will be entered into your electronic medical record.  These signatures attest to the fact that that the information above on your After Visit Summary has been reviewed and is understood.  Full responsibility of the confidentiality of this discharge information lies with you and/or your care-partner. 

## 2018-10-20 NOTE — Op Note (Signed)
[facility] Patient Name: Christopher Joyce Procedure Date: 10/20/2018 1:33 PM MRN: [id number] Endoscopist: Gerrit Heck , MD Age: 58 Referring MD:  Date of Birth: March 27, 1960 Gender: Male Account #: [id number] Procedure:                Colonoscopy Indications:              Screening for colorectal malignant neoplasm, This                            is the patient's first colonoscopy Medicines:                Monitored Anesthesia Care Procedure:                Pre-Anesthesia Assessment:                           - Prior to the procedure, a History and Physical                            was performed, and patient medications and                            allergies were reviewed. The patient's tolerance of                            previous anesthesia was also reviewed. The risks                            and benefits of the procedure and the sedation                            options and risks were discussed with the patient.                            All questions were answered, and informed consent                            was obtained. Prior Anticoagulants: The patient has                            taken no previous anticoagulant or antiplatelet                            agents. ASA Grade Assessment: II - A patient with                            mild systemic disease. After reviewing the risks                            and benefits, the patient was deemed in                            satisfactory condition to undergo the procedure.  After obtaining informed consent, the colonoscope                            was passed under direct vision. Throughout the                            procedure, the patient's blood pressure, pulse, and                            oxygen saturations were monitored continuously. The                            Colonoscope was introduced through the anus and                            advanced to the the  terminal ileum. The colonoscopy                            was performed without difficulty. The patient                            tolerated the procedure well. The quality of the                            bowel preparation was adequate. The terminal ileum,                            ileocecal valve, appendiceal orifice, and rectum                            were photographed. Scope In: 1:35:32 PM Scope Out: 1:49:44 PM Scope Withdrawal Time: 0 hours 12 minutes 39 seconds  Total Procedure Duration: 0 hours 14 minutes 12 seconds  Findings:                 The perianal and digital rectal examinations were                            normal.                           Three sessile polyps were found in the sigmoid                            colon (2) and ascending colon (1). The polyps were                            3 to 4 mm in size. These polyps were removed with a                            cold snare. Resection and retrieval were complete.                            Estimated blood loss was minimal.  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                           Anal papilla(e) were hypertrophied.                           The terminal ileum appeared normal.                           Retroflexion in the right colon was performed. Complications:            No immediate complications. Estimated Blood Loss:     Estimated blood loss was minimal. Impression:               - Three 3 to 4 mm polyps in the sigmoid colon and                            in the ascending colon, removed with a cold snare.                            Resected and retrieved.                           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                           - Anal papilla(e) were hypertrophied.                           - The examined portion of the  ileum was normal. Recommendation:           - Patient has a contact number available for                            emergencies. The signs and symptoms of potential                            delayed complications were discussed with the                            patient. Return to normal activities tomorrow.                            Written discharge instructions were provided to the                            patient.                           - Resume previous diet.                           -  Continue present medications.                           - Await pathology results.                           - Repeat colonoscopy for surveillance based on                            pathology results.                           - Return to GI office PRN. Gerrit Heck, MD 10/20/2018 1:56:05 PM

## 2018-10-20 NOTE — Progress Notes (Signed)
Report given to PACU, vss 

## 2018-10-20 NOTE — Progress Notes (Signed)
Called to room to assist during endoscopic procedure.  Patient ID and intended procedure confirmed with present staff. Received instructions for my participation in the procedure from the performing physician.  

## 2018-10-22 ENCOUNTER — Telehealth: Payer: Self-pay

## 2018-10-22 NOTE — Telephone Encounter (Signed)
  Follow up Call-  Call back number 10/20/2018  Post procedure Call Back phone  # [phone]  Permission to leave phone message Yes  Some recent data might be hidden     Patient questions:  Do you have a fever, pain , or abdominal swelling? No. Pain Score  0 *  Have you tolerated food without any problems? Yes.    Have you been able to return to your normal activities? Yes.    Do you have any questions about your discharge instructions: Diet   No. Medications  No. Follow up visit  No.  Do you have questions or concerns about your Care? No.  Actions: * If pain score is 4 or above: No action needed, pain <4.   1. Have you developed a fever since your procedure? no  2.   Have you had an respiratory symptoms (SOB or cough) since your procedure? no  3.   Have you tested positive for COVID 19 since your procedure no  4.   Have you had any family members/close contacts diagnosed with the COVID 19 since your procedure?  no   If yes to any of these questions please route to Joylene John, RN and Alphonsa Gin, Therapist, sports.

## 2018-10-27 ENCOUNTER — Encounter: Payer: Self-pay | Admitting: Gastroenterology

## 2019-03-22 ENCOUNTER — Other Ambulatory Visit: Payer: Self-pay

## 2019-03-23 ENCOUNTER — Ambulatory Visit (INDEPENDENT_AMBULATORY_CARE_PROVIDER_SITE_OTHER): Payer: Managed Care, Other (non HMO) | Admitting: Family Medicine

## 2019-03-23 ENCOUNTER — Encounter: Payer: Self-pay | Admitting: Family Medicine

## 2019-03-23 VITALS — BP 134/79 | HR 64 | Temp 97.3°F | Ht 66.0 in | Wt 222.2 lb

## 2019-03-23 DIAGNOSIS — L308 Other specified dermatitis: Secondary | ICD-10-CM

## 2019-03-23 DIAGNOSIS — E7800 Pure hypercholesterolemia, unspecified: Secondary | ICD-10-CM

## 2019-03-23 DIAGNOSIS — H6121 Impacted cerumen, right ear: Secondary | ICD-10-CM | POA: Insufficient documentation

## 2019-03-23 LAB — LIPID PANEL
Cholesterol: 197 mg/dL (ref 0–200)
HDL: 34 mg/dL — ABNORMAL LOW (ref >39.00)
LDL Cholesterol: 144 mg/dL — ABNORMAL HIGH (ref 0–99)
NonHDL: 163.1
Total CHOL/HDL Ratio: 6
Triglycerides: 96 mg/dL (ref 0.0–149.0)
VLDL: 19.2 mg/dL (ref 0.0–40.0)

## 2019-03-23 MED ORDER — TRIAMCINOLONE ACETONIDE 0.1 % EX OINT: 1.0000 "application " | TOPICAL_OINTMENT | Freq: Two times a day (BID) | CUTANEOUS | 0 refills | Status: DC

## 2019-03-23 NOTE — Progress Notes (Signed)
Established Patient Office Visit  Subjective:  Patient ID: Christopher Joyce, male    DOB: [birth year]  Age: 59 y.o. MRN: [id number]  CC:  Chief Complaint  Patient presents with  . Follow-up    6 month follow up, patient would like cream for dry skin, c/o of right ear pain that come and go    HPI Christopher Joyce presents for right ear pain and congestion, elevated LDL cholesterol rash on his forearms.  Receiving his first Covid vaccine tomorrow.  Continues to work from home.  He is exercising by walking and is been able to lose some weight.  He is required to decrease the fat and cholesterol in his diet.  Rash on his forearm had responded to the ointment I given him in the past.  Seem to return after he purchased a new car.  He notes the rash is present where he lays his arm on the armrest.  Past Medical History:  Diagnosis Date  . Chicken pox   . Headache(784.0)   . Hyperlipidemia     Past Surgical History:  Procedure Laterality Date  . WISDOM TOOTH EXTRACTION     1 tooth taken out. Was done about 5 years ago    Family History  Problem Relation Age of Onset  . Diabetes Mother   . Stroke Mother   . Hyperlipidemia Father   . Heart attack Father 65  . Early death Father   . Heart disease Father   . Heart attack Brother 49  . Hyperlipidemia Brother   . Early death Sister   . Drug abuse Sister   . Diabetes Brother   . Stroke Paternal Grandfather   . Heart attack Paternal Grandfather   . Diabetes Maternal Grandmother   . Cancer Neg Hx   . Hypertension Neg Hx   . Kidney disease Neg Hx   . Colon cancer Neg Hx   . Esophageal cancer Neg Hx   . Colon polyps Neg Hx   . Stomach cancer Neg Hx   . Rectal cancer Neg Hx     Social History   Socioeconomic History  . Marital status: Divorced    Spouse name: Not on file  . Number of children: 5  . Years of education: Assoc.5  . Highest education level: Not on file  Occupational History  . Occupation: Corporate treasurer    Comment: [employer]  Tobacco Use  . Smoking status: Former Research scientist (life sciences)  . Smokeless tobacco: Never Used  . Tobacco comment: quit smoking 2008  Substance and Sexual Activity  . Alcohol use: No  . Drug use: No  . Sexual activity: Yes  Other Topics Concern  . Not on file  Social History Narrative   Patient lives at home with his mom Terrence Dupont.    Patient has 5 children.    Patient is Divorced.    Patient is working full-time.    Patient is right-handed   Social Determinants of Radio broadcast assistant Strain:   . Difficulty of Paying Living Expenses:   Food Insecurity:   . Worried About Charity fundraiser in the Last Year:   . Arboriculturist in the Last Year:   Transportation Needs:   . Film/video editor (Medical):   Marland Kitchen Lack of Transportation (Non-Medical):   Physical Activity:   . Days of Exercise per Week:   . Minutes of Exercise per Session:   Stress:   . Feeling of Stress :   Social Connections:   .  Frequency of Communication with Friends and Family:   . Frequency of Social Gatherings with Friends and Family:   . Attends Religious Services:   . Active Member of Clubs or Organizations:   . Attends Archivist Meetings:   Marland Kitchen Marital Status:   Intimate Partner Violence:   . Fear of Current or Ex-Partner:   . Emotionally Abused:   Marland Kitchen Physically Abused:   . Sexually Abused:     Outpatient Medications Prior to Visit  Medication Sig Dispense Refill  . aspirin 81 MG tablet Take 81 mg by mouth daily.    . Omega-3 Fatty Acids (FISH OIL) 1200 MG CAPS Take 1 capsule by mouth daily.     . TURMERIC PO Take 300 mg by mouth daily.     No facility-administered medications prior to visit.    No Known Allergies  ROS Review of Systems  Constitutional: Negative.   HENT: Positive for hearing loss.   Eyes: Negative for photophobia and visual disturbance.  Respiratory: Negative.   Cardiovascular: Negative.   Musculoskeletal: Negative for gait problem and joint  swelling.  Skin: Positive for rash.  Neurological: Negative.   Psychiatric/Behavioral: Negative.       Objective:    Physical Exam  Constitutional: He is oriented to person, place, and time. He appears well-developed and well-nourished. No distress.  HENT:  Head: Normocephalic and atraumatic.  Right Ear: External ear normal. A foreign body (cerumen plug) is present.  Left Ear: Tympanic membrane, external ear and ear canal normal.  Eyes: Conjunctivae are normal. Right eye exhibits no discharge. Left eye exhibits no discharge. No scleral icterus.  Neck: No JVD present. No tracheal deviation present.  Cardiovascular: Normal rate, regular rhythm and normal heart sounds.  Pulmonary/Chest: Effort normal and breath sounds normal. No stridor.  Neurological: He is alert and oriented to person, place, and time.  Skin: Skin is warm and dry. He is not diaphoretic.  Psychiatric: He has a normal mood and affect. His behavior is normal.    BP 134/79   Pulse 64   Temp (!) 97.3 F (36.3 C) (Tympanic)   Ht 5' 6"  (1.676 m)   Wt 222 lb 3.2 oz (100.8 kg)   SpO2 95%   BMI 35.86 kg/m  Wt Readings from Last 3 Encounters:  03/23/19 222 lb 3.2 oz (100.8 kg)  10/20/18 226 lb (102.5 kg)  10/02/18 226 lb (102.5 kg)   The 10-year ASCVD risk score Mikey Bussing DC Jr., et al., 2013) is: 9%   Values used to calculate the score:     Age: 65 years     Sex: Male     Is Non-Hispanic African American: Yes     Diabetic: No     Tobacco smoker: No     Systolic Blood Pressure: 616 mmHg     Is BP treated: No     HDL Cholesterol: 37.7 mg/dL     Total Cholesterol: 204 mg/dL  Health Maintenance Due  Topic Date Due  . HIV Screening  Never done    There are no preventive care reminders to display for this patient.  Lab Results  Component Value Date   TSH 1.00 08/19/2012   Lab Results  Component Value Date   WBC 4.4 09/22/2018   HGB 14.4 09/22/2018   HCT 43.1 09/22/2018   MCV 89.9 09/22/2018   PLT 221.0  09/22/2018   Lab Results  Component Value Date   NA 139 09/22/2018   K 3.9 09/22/2018  CO2 27 09/22/2018   GLUCOSE 94 09/22/2018   BUN 12 09/22/2018   CREATININE 1.04 09/22/2018   BILITOT 0.4 09/22/2018   ALKPHOS 32 (L) 09/22/2018   AST 35 09/22/2018   ALT 23 09/22/2018   PROT 7.5 09/22/2018   ALBUMIN 4.3 09/22/2018   CALCIUM 9.8 09/22/2018   GFR 88.74 09/22/2018   Lab Results  Component Value Date   CHOL 204 (H) 09/22/2018   Lab Results  Component Value Date   HDL 37.70 (L) 09/22/2018   Lab Results  Component Value Date   LDLCALC 152 (H) 09/22/2018   Lab Results  Component Value Date   TRIG 70.0 09/22/2018   Lab Results  Component Value Date   CHOLHDL 5 09/22/2018   No results found for: HGBA1C     Subjective:    Christopher Joyce is a 59 y.o. male whom I am asked to see for evaluation of otalgia in the right ear for the past 3 month. There is a prior history of cerumen impaction. The patient has not been using ear drops to loosen wax immediately prior to this visit. The patient complains of ear pain.  The patient's history has been marked as reviewed and updated as appropriate.  Review of Systems Pertinent items are noted in HPI.    Objective:    Auditory canal(s) of the right ear are completely obstructed with cerumen.   Cerumen was removed using gentle irrigation. Tympanic membranes are intact following the procedure.  Auditory canals are normal.  TM is non inflammed but etracted. There is no effusion.    Assessment:    Cerumen Impaction without otitis externa.    Plan:    1. Care instructions given. 2. Home treatment: none. 3. Follow-up as needed.  Assessment & Plan:   Problem List Items Addressed This Visit      Nervous and Auditory   Ceruminosis, right     Musculoskeletal and Integument   Eczema   Relevant Medications   triamcinolone ointment (KENALOG) 0.1 %   Other Relevant Orders   Ambulatory referral to Dermatology     Other    Elevated LDL cholesterol level - Primary   Relevant Orders   Lipid panel      Meds ordered this encounter  Medications  . triamcinolone ointment (KENALOG) 0.1 %    Sig: Apply 1 application topically 2 (two) times daily.    Dispense:  80 g    Refill:  0    Follow-up: Return in about 1 year (around 03/22/2020).  We discussed his elevated 10-year risk score.  He will continue to work on diet and weight loss.  He will consider purchasing an over-the-counter earwax removal kit.  Follow-up will be in 1 year.  Libby Maw, MD

## 2019-03-23 NOTE — Patient Instructions (Signed)
Earwax Buildup, Adult The ears produce a substance called earwax that helps keep bacteria out of the ear and protects the skin in the ear canal. Occasionally, earwax can build up in the ear and cause discomfort or hearing loss. What increases the risk? This condition is more likely to develop in people who:  Are male.  Are elderly.  Naturally produce more earwax.  Clean their ears often with cotton swabs.  Use earplugs often.  Use in-ear headphones often.  Wear hearing aids.  Have narrow ear canals.  Have earwax that is overly thick or sticky.  Have eczema.  Are dehydrated.  Have excess hair in the ear canal. What are the signs or symptoms? Symptoms of this condition include:  Reduced or muffled hearing.  A feeling of fullness in the ear or feeling that the ear is plugged.  Fluid coming from the ear.  Ear pain.  Ear itch.  Ringing in the ear.  Coughing.  An obvious piece of earwax that can be seen inside the ear canal. How is this diagnosed? This condition may be diagnosed based on:  Your symptoms.  Your medical history.  An ear exam. During the exam, your health care provider will look into your ear with an instrument called an otoscope. You may have tests, including a hearing test. How is this treated? This condition may be treated by:  Using ear drops to soften the earwax.  Having the earwax removed by a health care provider. The health care provider may: ? Flush the ear with water. ? Use an instrument that has a loop on the end (curette). ? Use a suction device.  Surgery to remove the wax buildup. This may be done in severe cases. Follow these instructions at home:   Take over-the-counter and prescription medicines only as told by your health care provider.  Do not put any objects, including cotton swabs, into your ear. You can clean the opening of your ear canal with a washcloth or facial tissue.  Follow instructions from your health care  provider about cleaning your ears. Do not over-clean your ears.  Drink enough fluid to keep your urine clear or pale yellow. This will help to thin the earwax.  Keep all follow-up visits as told by your health care provider. If earwax builds up in your ears often or if you use hearing aids, consider seeing your health care provider for routine, preventive ear cleanings. Ask your health care provider how often you should schedule your cleanings.  If you have hearing aids, clean them according to instructions from the manufacturer and your health care provider. Contact a health care provider if:  You have ear pain.  You develop a fever.  You have blood, pus, or other fluid coming from your ear.  You have hearing loss.  You have ringing in your ears that does not go away.  Your symptoms do not improve with treatment.  You feel like the room is spinning (vertigo). Summary  Earwax can build up in the ear and cause discomfort or hearing loss.  The most common symptoms of this condition include reduced or muffled hearing and a feeling of fullness in the ear or feeling that the ear is plugged.  This condition may be diagnosed based on your symptoms, your medical history, and an ear exam.  This condition may be treated by using ear drops to soften the earwax or by having the earwax removed by a health care provider.  Do not put any   objects, including cotton swabs, into your ear. You can clean the opening of your ear canal with a washcloth or facial tissue. This information is not intended to replace advice given to you by your health care provider. Make sure you discuss any questions you have with your health care provider. Document Revised: 12/06/2016 Document Reviewed: 03/06/2016 Elsevier Patient Education  2020 [place].  Preventing High Cholesterol Cholesterol is a white, waxy substance similar to fat that the human body needs to help build cells. The liver makes all the  cholesterol that a person's body needs. Having high cholesterol (hypercholesterolemia) increases a person's risk for heart disease and stroke. Extra (excess) cholesterol comes from the food the person eats. High cholesterol can often be prevented with diet and lifestyle changes. If you already have high cholesterol, you can control it with diet and lifestyle changes and with medicine. How can high cholesterol affect me? If you have high cholesterol, deposits (plaques) may build up on the walls of your arteries. The arteries are the blood vessels that carry blood away from your heart. Plaques make the arteries narrower and stiffer. This can limit or block blood flow and cause blood clots to form. Blood clots:  Are tiny balls of cells that form in your blood.  Can move to the heart or brain, causing a heart attack or stroke. Plaques in arteries greatly increase your risk for heart attack and stroke.Making diet and lifestyle changes can reduce your risk for these conditions that may threaten your life. What can increase my risk? This condition is more likely to develop in people who:  Eat foods that are high in saturated fat or cholesterol. Saturated fat is mostly found in: ? Foods that contain animal fat, such as red meat and some dairy products. ? Certain fatty foods made from plants, such as tropical oils.  Are overweight.  Are not getting enough exercise.  Have a family history of high cholesterol. What actions can I take to prevent this? Nutrition   Eat less saturated fat.  Avoid trans fats (partially hydrogenated oils). These are often found in margarine and in some baked goods, fried foods, and snacks bought in packages.  Avoid precooked or cured meat, such as sausages or meat loaves.  Avoid foods and drinks that have added sugars.  Eat more fruits, vegetables, and whole grains.  Choose healthy sources of protein, such as fish, poultry, lean cuts of red meat, beans, peas,  lentils, and nuts.  Choose healthy sources of fat, such as: ? Nuts. ? Vegetable oils, especially olive oil. ? Fish that have healthy fats (omega-3 fatty acids), such as mackerel or salmon. The items listed above may not be a complete list of recommended foods and beverages. Contact a dietitian for more information. Lifestyle  Lose weight if you are overweight. Losing 5-10 lb (2.3-4.5 kg) can help prevent or control high cholesterol. It can also lower your risk for diabetes and high blood pressure. Ask your health care provider to help you with a diet and exercise plan to lose weight safely.  Do not use any products that contain nicotine or tobacco, such as cigarettes, e-cigarettes, and chewing tobacco. If you need help quitting, ask your health care provider.  Limit your alcohol intake. ? Do not drink alcohol if:  Your health care provider tells you not to drink.  You are pregnant, may be pregnant, or are planning to become pregnant. ? If you drink alcohol:  Limit how much you use to:  0-1 drink a day for women.  0-2 drinks a day for men.  Be aware of how much alcohol is in your drink. In the [place] one drink equals one 12 oz bottle of beer (355 mL), one 5 oz glass of wine (148 mL), or one 1 oz glass of hard liquor (44 mL). Activity   Get enough exercise. Each week, do at least 150 minutes of exercise that takes a medium level of effort (moderate-intensity exercise). ? This is exercise that:  Makes your heart beat faster and makes you breathe harder than usual.  Allows you to still be able to talk. ? You could exercise in short sessions several times a day or longer sessions a few times a week. For example, on 5 days each week, you could walk fast or ride your bike 3 times a day for 10 minutes each time.  Do exercises as told by your health care provider. Medicines  In addition to diet and lifestyle changes, your health care provider may recommend medicines to help lower  cholesterol. This may be a medicine to lower the amount of cholesterol your liver makes. You may need medicine if: ? Diet and lifestyle changes do not lower your cholesterol enough. ? You have high cholesterol and other risk factors for heart disease or stroke.  Take over-the-counter and prescription medicines only as told by your health care provider. General information  Manage your risk factors for high cholesterol. Talk with your health care provider about all your risk factors and how to lower your risk.  Manage other conditions that you have, such as diabetes or high blood pressure (hypertension).  Have blood tests to check your cholesterol levels at regular points in time as told by your health care provider.  Keep all follow-up visits as told by your health care provider. This is important. Where to find more information  American Heart Association: www.heart.org  National Heart, Lung, and Blood Institute: https://wilson-eaton.com/ Summary  High cholesterol increases your risk for heart disease and stroke. By keeping your cholesterol level low, you can reduce your risk for these conditions.  High cholesterol can often be prevented with diet and lifestyle changes.  Work with your health care provider to manage your risk factors, and have your blood tested regularly. This information is not intended to replace advice given to you by your health care provider. Make sure you discuss any questions you have with your health care provider. Document Revised: 04/17/2018 Document Reviewed: 09/02/2015 Elsevier Patient Education  2020 [employer].

## 2019-04-19 ENCOUNTER — Telehealth: Payer: Self-pay | Admitting: Family Medicine

## 2019-04-19 NOTE — Telephone Encounter (Signed)
Tried to call pt about fax we received from [facility], They said that they had been trying to contact pt regarding referral to their office several times and they have not received a returned call, They said they will no longer contact him but that they will be more than happy to schedule an appt if the pt calls their office to do so. Their number [phone]. I had to leave a message

## 2019-04-19 NOTE — Telephone Encounter (Signed)
Patient called and stated he no longer needs a referral to Dermatology.

## 2020-03-23 ENCOUNTER — Other Ambulatory Visit: Payer: Self-pay

## 2020-03-23 ENCOUNTER — Encounter: Payer: Self-pay | Admitting: Family Medicine

## 2020-03-23 ENCOUNTER — Ambulatory Visit (INDEPENDENT_AMBULATORY_CARE_PROVIDER_SITE_OTHER): Payer: 59 | Admitting: Family Medicine

## 2020-03-23 VITALS — BP 148/78 | HR 64 | Temp 97.0°F | Ht 66.5 in | Wt 225.0 lb

## 2020-03-23 DIAGNOSIS — E7800 Pure hypercholesterolemia, unspecified: Secondary | ICD-10-CM

## 2020-03-23 DIAGNOSIS — Z0000 Encounter for general adult medical examination without abnormal findings: Secondary | ICD-10-CM | POA: Diagnosis not present

## 2020-03-23 DIAGNOSIS — L308 Other specified dermatitis: Secondary | ICD-10-CM | POA: Diagnosis not present

## 2020-03-23 DIAGNOSIS — R030 Elevated blood-pressure reading, without diagnosis of hypertension: Secondary | ICD-10-CM | POA: Diagnosis not present

## 2020-03-23 DIAGNOSIS — R7989 Other specified abnormal findings of blood chemistry: Secondary | ICD-10-CM

## 2020-03-23 LAB — URINALYSIS, ROUTINE W REFLEX MICROSCOPIC
Bilirubin Urine: NEGATIVE
Hgb urine dipstick: NEGATIVE
Ketones, ur: 40 — AB
Leukocytes,Ua: NEGATIVE
Nitrite: NEGATIVE
RBC / HPF: NONE SEEN (ref 0–?)
Specific Gravity, Urine: 1.015 (ref 1.000–1.030)
Total Protein, Urine: NEGATIVE
Urine Glucose: NEGATIVE
Urobilinogen, UA: 1 (ref 0.0–1.0)
WBC, UA: NONE SEEN (ref 0–?)
pH: 6 (ref 5.0–8.0)

## 2020-03-23 LAB — CBC
HCT: 44 % (ref 39.0–52.0)
Hemoglobin: 14.5 g/dL (ref 13.0–17.0)
MCHC: 33 g/dL (ref 30.0–36.0)
MCV: 89.6 fl (ref 78.0–100.0)
Platelets: 297 10*3/uL (ref 150.0–400.0)
RBC: 4.91 Mil/uL (ref 4.22–5.81)
RDW: 13.7 % (ref 11.5–15.5)
WBC: 4.1 10*3/uL (ref 4.0–10.5)

## 2020-03-23 LAB — COMPREHENSIVE METABOLIC PANEL
ALT: 51 U/L (ref 0–53)
AST: 58 U/L — ABNORMAL HIGH (ref 0–37)
Albumin: 4.2 g/dL (ref 3.5–5.2)
Alkaline Phosphatase: 31 U/L — ABNORMAL LOW (ref 39–117)
BUN: 10 mg/dL (ref 6–23)
CO2: 25 mEq/L (ref 19–32)
Calcium: 9.6 mg/dL (ref 8.4–10.5)
Chloride: 99 mEq/L (ref 96–112)
Creatinine, Ser: 1.06 mg/dL (ref 0.40–1.50)
GFR: 76.79 mL/min (ref >60.00)
Glucose, Bld: 78 mg/dL (ref 70–99)
Potassium: 3.9 mEq/L (ref 3.5–5.1)
Sodium: 136 mEq/L (ref 135–145)
Total Bilirubin: 0.7 mg/dL (ref 0.2–1.2)
Total Protein: 7.8 g/dL (ref 6.0–8.3)

## 2020-03-23 LAB — LIPID PANEL
Cholesterol: 196 mg/dL (ref 0–200)
HDL: 36.2 mg/dL — ABNORMAL LOW (ref >39.00)
LDL Cholesterol: 144 mg/dL — ABNORMAL HIGH (ref 0–99)
NonHDL: 160.05
Total CHOL/HDL Ratio: 5
Triglycerides: 78 mg/dL (ref 0.0–149.0)
VLDL: 15.6 mg/dL (ref 0.0–40.0)

## 2020-03-23 LAB — PSA: PSA: 0.49 ng/mL (ref 0.10–4.00)

## 2020-03-23 MED ORDER — TRIAMCINOLONE ACETONIDE 0.1 % EX OINT: 1.0000 "application " | TOPICAL_OINTMENT | Freq: Two times a day (BID) | CUTANEOUS | 0 refills | Status: AC

## 2020-03-23 NOTE — Progress Notes (Addendum)
Established Patient Office Visit  Subjective:  Patient ID: Christopher Joyce, male    DOB: 12-08-1960  Age: 60 y.o. MRN: [id number]  CC:  Chief Complaint  Patient presents with   Annual Exam    Pt is here for annual physical, pt needs rx refill for eczema ointment, says it helped well.     HPI Christopher Joyce presents for for complete physical and follow-up of his elevated cholesterol.  No longer working from home and is back in the office.  Not exercising as much as he would like and does enjoy salty snacks and sweets.  Blood pressure was elevated at his dentist office.  He rested and it came back down.  Blood pressure taken by the nurse today was 160/88.  He has improved his diet since the first of the year.  Past Medical History:  Diagnosis Date   Chicken pox    Headache(784.0)    Hyperlipidemia     Past Surgical History:  Procedure Laterality Date   WISDOM TOOTH EXTRACTION     1 tooth taken out. Was done about 5 years ago    Family History  Problem Relation Age of Onset   Diabetes Mother    Stroke Mother    Hyperlipidemia Father    Heart attack Father 45   Early death Father    Heart disease Father    Heart attack Brother 60   Hyperlipidemia Brother    Early death Sister    Drug abuse Sister    Diabetes Brother    Stroke Paternal Grandfather    Heart attack Paternal Grandfather    Diabetes Maternal Grandmother    Cancer Neg Hx    Hypertension Neg Hx    Kidney disease Neg Hx    Colon cancer Neg Hx    Esophageal cancer Neg Hx    Colon polyps Neg Hx    Stomach cancer Neg Hx    Rectal cancer Neg Hx     Social History   Socioeconomic History   Marital status: Divorced    Spouse name: Not on file   Number of children: 5   Years of education: Assoc.5   Highest education level: Not on file  Occupational History   Occupation: Corporate treasurer    Comment: [employer]  Tobacco Use   Smoking status: Former Smoker    Smokeless tobacco: Never Used   Tobacco comment: quit smoking 2008  Vaping Use   Vaping Use: Never used  Substance and Sexual Activity   Alcohol use: No   Drug use: No   Sexual activity: Yes  Other Topics Concern   Not on file  Social History Narrative   Patient lives at home with his mom Christopher Joyce.    Patient has 5 children.    Patient is Divorced.    Patient is working full-time.    Patient is right-handed   Social Determinants of Radio broadcast assistant Strain: Not on file  Food Insecurity: Not on file  Transportation Needs: Not on file  Physical Activity: Not on file  Stress: Not on file  Social Connections: Not on file  Intimate Partner Violence: Not on file    Outpatient Medications Prior to Visit  Medication Sig Dispense Refill   aspirin 81 MG tablet Take 81 mg by mouth daily.     Omega-3 Fatty Acids (FISH OIL) 1200 MG CAPS Take 1 capsule by mouth daily.      TURMERIC PO Take 300 mg by mouth daily.  triamcinolone ointment (KENALOG) 0.1 % Apply 1 application topically 2 (two) times daily. 80 g 0   No facility-administered medications prior to visit.    No Known Allergies  ROS Review of Systems  Constitutional: Negative.   HENT: Negative.   Eyes: Negative for photophobia and visual disturbance.  Respiratory: Negative.   Cardiovascular: Negative.   Gastrointestinal: Negative.   Endocrine: Negative for polyphagia and polyuria.  Genitourinary: Negative for difficulty urinating, frequency and urgency.  Musculoskeletal: Negative.   Skin: Negative for color change and pallor.  Allergic/Immunologic: Negative for immunocompromised state.  Neurological: Negative for tremors and headaches.  Hematological: Does not bruise/bleed easily.  Psychiatric/Behavioral: Negative.       Objective:    Physical Exam Vitals and nursing note reviewed.  Constitutional:      General: He is not in acute distress.    Appearance: Normal appearance. He is not  ill-appearing, toxic-appearing or diaphoretic.  HENT:     Head: Normocephalic and atraumatic.     Right Ear: Tympanic membrane, ear canal and external ear normal.     Left Ear: Tympanic membrane, ear canal and external ear normal.     Mouth/Throat:     Mouth: Mucous membranes are moist.     Pharynx: Oropharynx is clear. No oropharyngeal exudate or posterior oropharyngeal erythema.  Eyes:     General: No scleral icterus.    Extraocular Movements: Extraocular movements intact.     Conjunctiva/sclera: Conjunctivae normal.     Pupils: Pupils are equal, round, and reactive to light.  Cardiovascular:     Rate and Rhythm: Normal rate and regular rhythm.  Pulmonary:     Effort: Pulmonary effort is normal.     Breath sounds: Normal breath sounds.  Abdominal:     General: Bowel sounds are normal. There is no distension.     Palpations: There is no mass.     Tenderness: There is no abdominal tenderness. There is no guarding or rebound.     Hernia: No hernia is present.  Musculoskeletal:     Cervical back: Normal range of motion and neck supple.     Right lower leg: No edema.     Left lower leg: No edema.  Skin:    General: Skin is warm and dry.     Coloration: Skin is not jaundiced.  Neurological:     Mental Status: He is alert and oriented to person, place, and time.  Psychiatric:        Mood and Affect: Mood normal.        Behavior: Behavior normal.     BP (!) 148/78    Pulse 64    Temp (!) 97 F (36.1 C) (Temporal)    Ht 5' 6.5" (1.689 m)    Wt 225 lb (102.1 kg)    SpO2 98%    BMI 35.77 kg/m  Wt Readings from Last 3 Encounters:  03/23/20 225 lb (102.1 kg)  03/23/19 222 lb 3.2 oz (100.8 kg)  10/20/18 226 lb (102.5 kg)     Health Maintenance Due  Topic Date Due   COVID-19 Vaccine (1) Never done   HIV Screening  Never done   INFLUENZA VACCINE  08/08/2019    There are no preventive care reminders to display for this patient.  Lab Results  Component Value Date   TSH  1.00 08/19/2012   Lab Results  Component Value Date   WBC 4.1 03/23/2020   HGB 14.5 03/23/2020   HCT 44.0 03/23/2020  MCV 89.6 03/23/2020   PLT 297.0 03/23/2020   Lab Results  Component Value Date   NA 136 03/23/2020   K 3.9 03/23/2020   CO2 25 03/23/2020   GLUCOSE 78 03/23/2020   BUN 10 03/23/2020   CREATININE 1.06 03/23/2020   BILITOT 0.7 03/23/2020   ALKPHOS 31 (L) 03/23/2020   AST 58 (H) 03/23/2020   ALT 51 03/23/2020   PROT 7.8 03/23/2020   ALBUMIN 4.2 03/23/2020   CALCIUM 9.6 03/23/2020   GFR 76.79 03/23/2020   Lab Results  Component Value Date   CHOL 196 03/23/2020   Lab Results  Component Value Date   HDL 36.20 (L) 03/23/2020   Lab Results  Component Value Date   LDLCALC 144 (H) 03/23/2020   Lab Results  Component Value Date   TRIG 78.0 03/23/2020   Lab Results  Component Value Date   CHOLHDL 5 03/23/2020   No results found for: HGBA1C    Assessment & Plan:   Problem List Items Addressed This Visit      Musculoskeletal and Integument   Eczema   Relevant Medications   triamcinolone ointment (KENALOG) 0.1 %     Other   Elevated blood pressure reading without diagnosis of hypertension   Relevant Orders   CBC (Completed)   Comprehensive metabolic panel (Completed)   Urinalysis, Routine w reflex microscopic (Completed)   Healthcare maintenance   Relevant Orders   PSA (Completed)   Urinalysis, Routine w reflex microscopic (Completed)   Elevated LDL cholesterol level - Primary   Relevant Medications   atorvastatin (LIPITOR) 20 MG tablet   Other Relevant Orders   Comprehensive metabolic panel (Completed)   Lipid panel (Completed)   Elevated LFTs      Meds ordered this encounter  Medications   triamcinolone ointment (KENALOG) 0.1 %    Sig: Apply 1 application topically 2 (two) times daily.    Dispense:  80 g    Refill:  0   atorvastatin (LIPITOR) 20 MG tablet    Sig: Take 1 tablet (20 mg total) by mouth daily.    Dispense:  90  tablet    Refill:  1    Follow-up: Return in about 3 months (around 06/23/2020), or if symptoms worsen or fail to improve.  Patient will obtain a blood pressure cuff and start checking his blood pressure randomly.  We discussed the possibility of starting a statin with his elevated LDL cholesterol and low HDL and family history of heart disease.  He agrees.  Given information on health maintenance disease prevention as well as information on how to check your blood pressure as well as preventing high cholesterol and hypertension.  Libby Maw, MD

## 2020-03-23 NOTE — Patient Instructions (Signed)
Health Maintenance, Male Adopting a healthy lifestyle and getting preventive care are important in promoting health and wellness. Ask your health care provider about:  The right schedule for you to have regular tests and exams.  Things you can do on your own to prevent diseases and keep yourself healthy. What should I know about diet, weight, and exercise? Eat a healthy diet  Eat a diet that includes plenty of vegetables, fruits, low-fat dairy products, and lean protein.  Do not eat a lot of foods that are high in solid fats, added sugars, or sodium.   Maintain a healthy weight Body mass index (BMI) is a measurement that can be used to identify possible weight problems. It estimates body fat based on height and weight. Your health care provider can help determine your BMI and help you achieve or maintain a healthy weight. Get regular exercise Get regular exercise. This is one of the most important things you can do for your health. Most adults should:  Exercise for at least 150 minutes each week. The exercise should increase your heart rate and make you sweat (moderate-intensity exercise).  Do strengthening exercises at least twice a week. This is in addition to the moderate-intensity exercise.  Spend less time sitting. Even light physical activity can be beneficial. Watch cholesterol and blood lipids Have your blood tested for lipids and cholesterol at 60 years of age, then have this test every 5 years. You may need to have your cholesterol levels checked more often if:  Your lipid or cholesterol levels are high.  You are older than 60 years of age.  You are at high risk for heart disease. What should I know about cancer screening? Many types of cancers can be detected early and may often be prevented. Depending on your health history and family history, you may need to have cancer screening at various ages. This may include screening for:  Colorectal cancer.  Prostate  cancer.  Skin cancer.  Lung cancer. What should I know about heart disease, diabetes, and high blood pressure? Blood pressure and heart disease  High blood pressure causes heart disease and increases the risk of stroke. This is more likely to develop in people who have high blood pressure readings, are of African descent, or are overweight.  Talk with your health care provider about your target blood pressure readings.  Have your blood pressure checked: ? Every 3-5 years if you are 18-39 years of age. ? Every year if you are 40 years old or older.  If you are between the ages of 65 and 75 and are a current or former smoker, ask your health care provider if you should have a one-time screening for abdominal aortic aneurysm (AAA). Diabetes Have regular diabetes screenings. This checks your fasting blood sugar level. Have the screening done:  Once every three years after age 45 if you are at a normal weight and have a low risk for diabetes.  More often and at a younger age if you are overweight or have a high risk for diabetes. What should I know about preventing infection? Hepatitis B If you have a higher risk for hepatitis B, you should be screened for this virus. Talk with your health care provider to find out if you are at risk for hepatitis B infection. Hepatitis C Blood testing is recommended for:  Everyone born from 1945 through 1965.  Anyone with known risk factors for hepatitis C. Sexually transmitted infections (STIs)  You should be screened each   year for STIs, including gonorrhea and chlamydia, if: ? You are sexually active and are younger than 60 years of age. ? You are older than 60 years of age and your health care provider tells you that you are at risk for this type of infection. ? Your sexual activity has changed since you were last screened, and you are at increased risk for chlamydia or gonorrhea. Ask your health care provider if you are at risk.  Ask your  health care provider about whether you are at high risk for HIV. Your health care provider may recommend a prescription medicine to help prevent HIV infection. If you choose to take medicine to prevent HIV, you should first get tested for HIV. You should then be tested every 3 months for as long as you are taking the medicine. Follow these instructions at home: Lifestyle  Do not use any products that contain nicotine or tobacco, such as cigarettes, e-cigarettes, and chewing tobacco. If you need help quitting, ask your health care provider.  Do not use street drugs.  Do not share needles.  Ask your health care provider for help if you need support or information about quitting drugs. Alcohol use  Do not drink alcohol if your health care provider tells you not to drink.  If you drink alcohol: ? Limit how much you have to 0-2 drinks a day. ? Be aware of how much alcohol is in your drink. In the [place] one drink equals one 12 oz bottle of beer (355 mL), one 5 oz glass of wine (148 mL), or one 1 oz glass of hard liquor (44 mL). General instructions  Schedule regular health, dental, and eye exams.  Stay current with your vaccines.  Tell your health care provider if: ? You often feel depressed. ? You have ever been abused or do not feel safe at home. Summary  Adopting a healthy lifestyle and getting preventive care are important in promoting health and wellness.  Follow your health care provider's instructions about healthy diet, exercising, and getting tested or screened for diseases.  Follow your health care provider's instructions on monitoring your cholesterol and blood pressure. This information is not intended to replace advice given to you by your health care provider. Make sure you discuss any questions you have with your health care provider. Document Revised: 12/17/2017 Document Reviewed: 12/17/2017 Elsevier Patient Education  2021 [place].  How to Take Your Blood  Pressure Blood pressure measures how strongly your blood is pressing against the walls of your arteries. Arteries are blood vessels that carry blood from your heart throughout your body. You can take your blood pressure at home with a machine. You may need to check your blood pressure at home:  To check if you have high blood pressure (hypertension).  To check your blood pressure over time.  To make sure your blood pressure medicine is working. Supplies needed:  Blood pressure machine, or monitor.  Dining room chair to sit in.  Table or desk.  Small notebook.  Pencil or pen. How to prepare Avoid these things for 30 minutes before checking your blood pressure:  Having drinks with caffeine in them, such as coffee or tea.  Drinking alcohol.  Eating.  Smoking.  Exercising. Do these things five minutes before checking your blood pressure:  Go to the bathroom and pee (urinate).  Sit in a dining chair. Do not sit in a soft couch or an armchair.  Be quiet. Do not talk. How to take  your blood pressure Follow the instructions that came with your machine. If you have a digital blood pressure monitor, these may be the instructions: 1. Sit up straight. 2. Place your feet on the floor. Do not cross your ankles or legs. 3. Rest your left arm at the level of your heart. You may rest it on a table, desk, or chair. 4. Pull up your shirt sleeve. 5. Wrap the blood pressure cuff around the upper part of your left arm. The cuff should be 1 inch (2.5 cm) above your elbow. It is best to wrap the cuff around bare skin. 6. Fit the cuff snugly around your arm. You should be able to place only one finger between the cuff and your arm. 7. Place the cord so that it rests in the bend of your elbow. 8. Press the power button. 9. Sit quietly while the cuff fills with air and loses air. 10. Write down the numbers on the screen. 11. Wait 2-3 minutes and then repeat steps 1-10.   What do the numbers  mean? Two numbers make up your blood pressure. The first number is called systolic pressure. The second is called diastolic pressure. An example of a blood pressure reading is "120 over 80" (or 120/80). If you are an adult and do not have a medical condition, use this guide to find out if your blood pressure is normal: Normal  First number: below 120.  Second number: below 80. Elevated  First number: 120-129.  Second number: below 80. Hypertension stage 1  First number: 130-139.  Second number: 80-89. Hypertension stage 2  First number: 140 or above.  Second number: 67 or above. Your blood pressure is above normal even if only the top or bottom number is above normal. Follow these instructions at home:  Check your blood pressure as often as your doctor tells you to.  Check your blood pressure at the same time every day.  Take your monitor to your next doctor's appointment. Your doctor will: ? Make sure you are using it correctly. ? Make sure it is working right.  Make sure you understand what your blood pressure numbers should be.  Tell your doctor if your medicine is causing side effects.  Keep all follow-up visits as told by your doctor. This is important. General tips:  You will need a blood pressure machine, or monitor. Your doctor can suggest a monitor. You can buy one at a drugstore or online. When choosing one: ? Choose one with an arm cuff. ? Choose one that wraps around your upper arm. Only one finger should fit between your arm and the cuff. ? Do not choose one that measures your blood pressure from your wrist or finger. Where to find more information American Heart Association: www.heart.org Contact a doctor if:  Your blood pressure keeps being high. Get help right away if:  Your first blood pressure number is higher than 180.  Your second blood pressure number is higher than 120. Summary  Check your blood pressure at the same time every  day.  Avoid caffeine, alcohol, smoking, and exercise for 30 minutes before checking your blood pressure.  Make sure you understand what your blood pressure numbers should be. This information is not intended to replace advice given to you by your health care provider. Make sure you discuss any questions you have with your health care provider. Document Revised: 12/18/2018 Document Reviewed: 12/18/2018 Elsevier Patient Education  [place].  Preventing High Cholesterol Cholesterol  is a white, waxy substance similar to fat that the human body needs to help build cells. The liver makes all the cholesterol that a person's body needs. Having high cholesterol (hypercholesterolemia) increases your risk for heart disease and stroke. Extra or excess cholesterol comes from the food that you eat. High cholesterol can often be prevented with diet and lifestyle changes. If you already have high cholesterol, you can control it with diet, lifestyle changes, and medicines. How can high cholesterol affect me? If you have high cholesterol, fatty deposits (plaques) may build up on the walls of your blood vessels. The blood vessels that carry blood away from your heart are called arteries. Plaques make the arteries narrower and stiffer. This in turn can:  Restrict or block blood flow and cause blood clots to form.  Increase your risk for heart attack and stroke. What can increase my risk for high cholesterol? This condition is more likely to develop in people who:  Eat foods that are high in saturated fat or cholesterol. Saturated fat is mostly found in foods that come from animal sources.  Are overweight.  Are not getting enough exercise.  Have a family history of high cholesterol (familial hypercholesterolemia). What actions can I take to prevent this? Nutrition  Eat less saturated fat.  Avoid trans fats (partially hydrogenated oils). These are often found in margarine and in some baked  goods, fried foods, and snacks bought in packages.  Avoid precooked or cured meat, such as bacon, sausages, or meat loaves.  Avoid foods and drinks that have added sugars.  Eat more fruits, vegetables, and whole grains.  Choose healthy sources of protein, such as fish, poultry, lean cuts of red meat, beans, peas, lentils, and nuts.  Choose healthy sources of fat, such as: ? Nuts. ? Vegetable oils, especially olive oil. ? Fish that have healthy fats, such as omega-3 fatty acids. These fish include mackerel or salmon.   Lifestyle  Lose weight if you are overweight. Maintaining a healthy body mass index (BMI) can help prevent or control high cholesterol. It can also lower your risk for diabetes and high blood pressure. Ask your health care provider to help you with a diet and exercise plan to lose weight safely.  Do not use any products that contain nicotine or tobacco, such as cigarettes, e-cigarettes, and chewing tobacco. If you need help quitting, ask your health care provider. Alcohol use  Do not drink alcohol if: ? Your health care provider tells you not to drink. ? You are pregnant, may be pregnant, or are planning to become pregnant.  If you drink alcohol: ? Limit how much you use to:  0-1 drink a day for women.  0-2 drinks a day for men. ? Be aware of how much alcohol is in your drink. In the [place] one drink equals one 12 oz bottle of beer (355 mL), one 5 oz glass of wine (148 mL), or one 1 oz glass of hard liquor (44 mL). Activity  Get enough exercise. Do exercises as told by your health care provider.  Each week, do at least 150 minutes of exercise that takes a medium level of effort (moderate-intensity exercise). This kind of exercise: ? Makes your heart beat faster while allowing you to still be able to talk. ? Can be done in short sessions several times a day or longer sessions a few times a week. For example, on 5 days each week, you could walk fast or ride your bike  3  times a day for 10 minutes each time.   Medicines  Your health care provider may recommend medicines to help lower cholesterol. This may be a medicine to lower the amount of cholesterol that your liver makes. You may need medicine if: ? Diet and lifestyle changes have not lowered your cholesterol enough. ? You have high cholesterol and other risk factors for heart disease or stroke.  Take over-the-counter and prescription medicines only as told by your health care provider. General information  Manage your risk factors for high cholesterol. Talk with your health care provider about all your risk factors and how to lower your risk.  Manage other conditions that you have, such as diabetes or high blood pressure (hypertension).  Have blood tests to check your cholesterol levels at regular points in time as told by your health care provider.  Keep all follow-up visits as told by your health care provider. This is important. Where to find more information  American Heart Association: www.heart.org  National Heart, Lung, and Blood Institute: https://wilson-eaton.com/ Summary  High cholesterol increases your risk for heart disease and stroke. By keeping your cholesterol level low, you can reduce your risk for these conditions.  High cholesterol can often be prevented with diet and lifestyle changes.  Work with your health care provider to manage your risk factors, and have your blood tested regularly. This information is not intended to replace advice given to you by your health care provider. Make sure you discuss any questions you have with your health care provider. Document Revised: 10/06/2018 Document Reviewed: 10/06/2018 Elsevier Patient Education  2021 [place].  Preventing Hypertension Hypertension, also called high blood pressure, is when the force of blood pumping through the arteries is too strong. Arteries are blood vessels that carry blood from the heart throughout the body.  Often, hypertension does not cause symptoms until blood pressure is very high. It is important to have your blood pressure checked regularly. Diet and lifestyle changes can help you prevent hypertension, and they may make you feel better overall and improve your quality of life. If you already have hypertension, you may control it with diet and lifestyle changes, as well as with medicine. How can this condition affect me? Over time, hypertension can damage the arteries and decrease blood flow to important parts of the body, including the brain, heart, and kidneys. By keeping your blood pressure in a healthy range, you can help prevent complications like heart attack, heart failure, stroke, kidney failure, and vascular dementia. What can increase my risk?  Being an older adult. Older people are more often affected.  Having family members who have had high blood pressure.  Being obese.  Being male. Males are more likely to have high blood pressure.  Drinking too much alcohol or caffeine.  Smoking or using illegal drugs.  Taking certain medicines, such as antidepressants, decongestants, birth control pills, and NSAIDs, such as ibuprofen.  Having thyroid problems.  Having certain tumors. What actions can I take to prevent or manage this condition? Work with your health care provider to make a hypertension prevention plan that works for you. Follow your plan and keep all follow-up visits as told by your health care provider. Diet changes Maintain a healthy diet. This includes:  Eating less salt (sodium). Ask your health care provider how much sodium is safe for you to have. The general recommendation is to have less than 1 tsp (2,300 mg) of sodium a day. ? Do not add salt to your  food. ? Choose low-sodium options when grocery shopping and eating out.  Limiting fats in your diet. You can do this by eating low-fat or fat-free dairy products and by eating less red meat.  Eating more fruits,  vegetables, and whole grains. Make a goal to eat: ? 1-2 cups of fresh fruits and vegetables each day. ? 3-4 servings of whole grains each day.  Avoiding foods and beverages that have added sugars.  Eating fish that contain healthy fats (omega-3 fatty acids), such as mackerel or salmon. If you need help putting together a healthy eating plan, try the DASH diet. This diet is high in fruits, vegetables, and whole grains. It is low in sodium, red meat, and added sugars. DASH stands for Dietary Approaches to Stop Hypertension.   Lifestyle changes Lose weight if you are overweight. Losing just 3?5% of your body weight can help prevent or control hypertension. For example, if your present weight is 200 lb (91 kg), a loss of 3-5% of your weight means losing 6-10 lb (2.7-4.5 kg). Ask your health care provider to help you with a diet and exercise plan to safely lose weight. Other recommendations usually include:  Get enough exercise. Do at least 150 minutes of moderate-intensity exercise each week. You could do this in short exercise sessions several times a day, or you could do longer exercise sessions a few times a week. For example, you could take a brisk 10-minute walk or bike ride, 3 times a day, for 5 days a week.  Find ways to reduce stress, such as exercising, meditating, listening to music, or taking a yoga class. If you need help reducing stress, ask your health care provider.  Do not use any products that contain nicotine or tobacco, such as cigarettes, e-cigarettes, and chewing tobacco. If you need help quitting, ask your health care provider. Chemicals in tobacco and nicotine products raise your blood pressure each time you use them. If you need help quitting, ask your health care provider.  Learn how to check your blood pressure at home. Make sure that you know your personal target blood pressure, as told by your health care provider.  Try to sleep 7-9 hours per night.   Alcohol use  Do  not drink alcohol if: ? Your health care provider tells you not to drink. ? You are pregnant, may be pregnant, or are planning to become pregnant.  If you drink alcohol: ? Limit how much you use to:  0-1 drink a day for women.  0-2 drinks a day for men. ? Be aware of how much alcohol is in your drink. In the [place] one drink equals one 12 oz bottle of beer (355 mL), one 5 oz glass of wine (148 mL), or one 1 oz glass of hard liquor (44 mL). Medicines In addition to diet and lifestyle changes, your health care provider may recommend medicines to help lower your blood pressure. In general:  You may need to try a few different medicines to find what works best for you.  You may need to take more than one medicine.  Take over-the-counter and prescription medicines only as told by your health care provider. Questions to ask your health care provider  What is my blood pressure goal?  How can I lower my risk for high blood pressure?  How should I monitor my blood pressure at home? Where to find support Your health care provider can help you prevent hypertension and help you keep your blood pressure at  a healthy level. Your local hospital or your community may also provide support services and prevention programs. The American Heart Association offers an online support network at supportnetwork.heart.org Where to find more information Learn more about hypertension from:  [place], Lung, and [place]: https://wilson-eaton.com/  Centers for Disease Control and Prevention: http://www.wolf.info/  American Academy of Family Physicians: familydoctor.org Learn more about the DASH diet from:  [place], Lung, and [place]: https://wilson-eaton.com/ Contact a health care provider if:  You think you are having a reaction to medicines you have taken.  You have recurrent headaches or feel dizzy.  You have swelling in your ankles.  You have trouble with your vision. Get help right away  if:  You have sudden, severe chest, back, or abdominal pain or discomfort.  You have shortness of breath.  You have a sudden, severe headache. These symptoms may represent a serious problem that is an emergency. Do not wait to see if the symptoms will go away. Get medical help right away. Call your local emergency services (911 in the [place] Do not drive yourself to the hospital.  Summary  Hypertension often does not cause any symptoms until blood pressure is very high. It is important to get your blood pressure checked regularly.  Diet and lifestyle changes are important steps in preventing hypertension.  By keeping your blood pressure in a healthy range, you may prevent complications like heart attack, heart failure, stroke, and kidney failure.  Work with your health care provider to make a hypertension prevention plan that works for you. This information is not intended to replace advice given to you by your health care provider. Make sure you discuss any questions you have with your health care provider. Document Revised: 11/24/2018 Document Reviewed: 11/24/2018 Elsevier Patient Education  2021 [place].  Preventing High Cholesterol Cholesterol is a white, waxy substance similar to fat that the human body needs to help build cells. The liver makes all the cholesterol that a person's body needs. Having high cholesterol (hypercholesterolemia) increases your risk for heart disease and stroke. Extra or excess cholesterol comes from the food that you eat. High cholesterol can often be prevented with diet and lifestyle changes. If you already have high cholesterol, you can control it with diet, lifestyle changes, and medicines. How can high cholesterol affect me? If you have high cholesterol, fatty deposits (plaques) may build up on the walls of your blood vessels. The blood vessels that carry blood away from your heart are called arteries. Plaques make the arteries narrower and stiffer.  This in turn can:  Restrict or block blood flow and cause blood clots to form.  Increase your risk for heart attack and stroke. What can increase my risk for high cholesterol? This condition is more likely to develop in people who:  Eat foods that are high in saturated fat or cholesterol. Saturated fat is mostly found in foods that come from animal sources.  Are overweight.  Are not getting enough exercise.  Have a family history of high cholesterol (familial hypercholesterolemia). What actions can I take to prevent this? Nutrition  Eat less saturated fat.  Avoid trans fats (partially hydrogenated oils). These are often found in margarine and in some baked goods, fried foods, and snacks bought in packages.  Avoid precooked or cured meat, such as bacon, sausages, or meat loaves.  Avoid foods and drinks that have added sugars.  Eat more fruits, vegetables, and whole grains.  Choose healthy sources of protein, such as  fish, poultry, lean cuts of red meat, beans, peas, lentils, and nuts.  Choose healthy sources of fat, such as: ? Nuts. ? Vegetable oils, especially olive oil. ? Fish that have healthy fats, such as omega-3 fatty acids. These fish include mackerel or salmon.   Lifestyle  Lose weight if you are overweight. Maintaining a healthy body mass index (BMI) can help prevent or control high cholesterol. It can also lower your risk for diabetes and high blood pressure. Ask your health care provider to help you with a diet and exercise plan to lose weight safely.  Do not use any products that contain nicotine or tobacco, such as cigarettes, e-cigarettes, and chewing tobacco. If you need help quitting, ask your health care provider. Alcohol use  Do not drink alcohol if: ? Your health care provider tells you not to drink. ? You are pregnant, may be pregnant, or are planning to become pregnant.  If you drink alcohol: ? Limit how much you use to:  0-1 drink a day for  women.  0-2 drinks a day for men. ? Be aware of how much alcohol is in your drink. In the [place] one drink equals one 12 oz bottle of beer (355 mL), one 5 oz glass of wine (148 mL), or one 1 oz glass of hard liquor (44 mL). Activity  Get enough exercise. Do exercises as told by your health care provider.  Each week, do at least 150 minutes of exercise that takes a medium level of effort (moderate-intensity exercise). This kind of exercise: ? Makes your heart beat faster while allowing you to still be able to talk. ? Can be done in short sessions several times a day or longer sessions a few times a week. For example, on 5 days each week, you could walk fast or ride your bike 3 times a day for 10 minutes each time.   Medicines  Your health care provider may recommend medicines to help lower cholesterol. This may be a medicine to lower the amount of cholesterol that your liver makes. You may need medicine if: ? Diet and lifestyle changes have not lowered your cholesterol enough. ? You have high cholesterol and other risk factors for heart disease or stroke.  Take over-the-counter and prescription medicines only as told by your health care provider. General information  Manage your risk factors for high cholesterol. Talk with your health care provider about all your risk factors and how to lower your risk.  Manage other conditions that you have, such as diabetes or high blood pressure (hypertension).  Have blood tests to check your cholesterol levels at regular points in time as told by your health care provider.  Keep all follow-up visits as told by your health care provider. This is important. Where to find more information  American Heart Association: www.heart.org  National Heart, Lung, and Blood Institute: https://wilson-eaton.com/ Summary  High cholesterol increases your risk for heart disease and stroke. By keeping your cholesterol level low, you can reduce your risk for these  conditions.  High cholesterol can often be prevented with diet and lifestyle changes.  Work with your health care provider to manage your risk factors, and have your blood tested regularly. This information is not intended to replace advice given to you by your health care provider. Make sure you discuss any questions you have with your health care provider. Document Revised: 10/06/2018 Document Reviewed: 10/06/2018 Elsevier Patient Education  [place].

## 2020-03-24 DIAGNOSIS — R7989 Other specified abnormal findings of blood chemistry: Secondary | ICD-10-CM | POA: Insufficient documentation

## 2020-03-24 MED ORDER — ATORVASTATIN CALCIUM 20 MG PO TABS: 20.0000 mg | ORAL_TABLET | Freq: Every day | ORAL | 1 refills | Status: DC

## 2020-03-24 NOTE — Addendum Note (Signed)
Addended by: Jon Billings on: 03/24/2020 08:06 AM   Modules accepted: Orders

## 2020-06-26 ENCOUNTER — Other Ambulatory Visit: Payer: Self-pay

## 2020-06-26 ENCOUNTER — Encounter: Payer: Self-pay | Admitting: Family Medicine

## 2020-06-26 ENCOUNTER — Ambulatory Visit: Payer: 59 | Admitting: Family Medicine

## 2020-06-26 VITALS — BP 136/74 | HR 62 | Temp 97.1°F | Ht 66.0 in | Wt 227.0 lb

## 2020-06-26 DIAGNOSIS — Z6836 Body mass index (BMI) 36.0-36.9, adult: Secondary | ICD-10-CM

## 2020-06-26 DIAGNOSIS — K760 Fatty (change of) liver, not elsewhere classified: Secondary | ICD-10-CM

## 2020-06-26 DIAGNOSIS — E7800 Pure hypercholesterolemia, unspecified: Secondary | ICD-10-CM

## 2020-06-26 DIAGNOSIS — R030 Elevated blood-pressure reading, without diagnosis of hypertension: Secondary | ICD-10-CM | POA: Diagnosis not present

## 2020-06-26 DIAGNOSIS — R7989 Other specified abnormal findings of blood chemistry: Secondary | ICD-10-CM | POA: Diagnosis not present

## 2020-06-26 DIAGNOSIS — K829 Disease of gallbladder, unspecified: Secondary | ICD-10-CM

## 2020-06-26 LAB — BASIC METABOLIC PANEL
BUN: 9 mg/dL (ref 6–23)
CO2: 28 mEq/L (ref 19–32)
Calcium: 9.5 mg/dL (ref 8.4–10.5)
Chloride: 102 mEq/L (ref 96–112)
Creatinine, Ser: 1.15 mg/dL (ref 0.40–1.50)
GFR: 69.51 mL/min (ref >60.00)
Glucose, Bld: 101 mg/dL — ABNORMAL HIGH (ref 70–99)
Potassium: 4.2 mEq/L (ref 3.5–5.1)
Sodium: 138 mEq/L (ref 135–145)

## 2020-06-26 LAB — LIPID PANEL
Cholesterol: 107 mg/dL (ref 0–200)
HDL: 33 mg/dL — ABNORMAL LOW (ref >39.00)
LDL Cholesterol: 63 mg/dL (ref 0–99)
NonHDL: 74.43
Total CHOL/HDL Ratio: 3
Triglycerides: 58 mg/dL (ref 0.0–149.0)
VLDL: 11.6 mg/dL (ref 0.0–40.0)

## 2020-06-26 LAB — HEPATIC FUNCTION PANEL
ALT: 49 U/L (ref 0–53)
AST: 60 U/L — ABNORMAL HIGH (ref 0–37)
Albumin: 4.4 g/dL (ref 3.5–5.2)
Alkaline Phosphatase: 31 U/L — ABNORMAL LOW (ref 39–117)
Bilirubin, Direct: 0.2 mg/dL (ref 0.0–0.3)
Total Bilirubin: 0.8 mg/dL (ref 0.2–1.2)
Total Protein: 7.5 g/dL (ref 6.0–8.3)

## 2020-06-26 LAB — TSH: TSH: 1.34 u[IU]/mL (ref 0.35–4.50)

## 2020-06-26 NOTE — Progress Notes (Addendum)
Established Patient Office Visit  Subjective:  Patient ID: Christopher Joyce, male    DOB: 09/22/1960  Age: 60 y.o. MRN: [id number]  CC:  Chief Complaint  Patient presents with   Follow-up    3 month f/u on cholesterol, patient fasting. Concerns about not being able to loss weight.     HPI Christopher Joyce presents for follow-up of elevated LDL cholesterol with new start of atorvastatin.  Patient is having no issues taking atorvastatin.  Denies muscle aches and pains with it.  Has had difficulty losing weight.  Does not always exercise as much as he would like.  He notes that it is more difficult for him to lose weight and it is for him to gain weight.  Past Medical History:  Diagnosis Date   Chicken pox    Headache(784.0)    Hyperlipidemia     Past Surgical History:  Procedure Laterality Date   WISDOM TOOTH EXTRACTION     1 tooth taken out. Was done about 5 years ago    Family History  Problem Relation Age of Onset   Diabetes Mother    Stroke Mother    Hyperlipidemia Father    Heart attack Father 4   Early death Father    Heart disease Father    Heart attack Brother 13   Hyperlipidemia Brother    Early death Sister    Drug abuse Sister    Diabetes Brother    Stroke Paternal Grandfather    Heart attack Paternal Grandfather    Diabetes Maternal Grandmother    Cancer Neg Hx    Hypertension Neg Hx    Kidney disease Neg Hx    Colon cancer Neg Hx    Esophageal cancer Neg Hx    Colon polyps Neg Hx    Stomach cancer Neg Hx    Rectal cancer Neg Hx     Social History   Socioeconomic History   Marital status: Divorced    Spouse name: Not on file   Number of children: 5   Years of education: Assoc.5   Highest education level: Not on file  Occupational History   Occupation: Corporate treasurer    Comment: [employer]  Tobacco Use   Smoking status: Former    Pack years: 0.00   Smokeless tobacco: Never   Tobacco comments:    quit smoking 2008  Vaping Use    Vaping Use: Never used  Substance and Sexual Activity   Alcohol use: No   Drug use: No   Sexual activity: Yes  Other Topics Concern   Not on file  Social History Narrative   Patient lives at home with his mom Terrence Dupont.    Patient has 5 children.    Patient is Divorced.    Patient is working full-time.    Patient is right-handed   Social Determinants of Radio broadcast assistant Strain: Not on file  Food Insecurity: Not on file  Transportation Needs: Not on file  Physical Activity: Not on file  Stress: Not on file  Social Connections: Not on file  Intimate Partner Violence: Not on file    Outpatient Medications Prior to Visit  Medication Sig Dispense Refill   aspirin 81 MG tablet Take 81 mg by mouth daily.     atorvastatin (LIPITOR) 20 MG tablet Take 1 tablet (20 mg total) by mouth daily. 90 tablet 1   Omega-3 Fatty Acids (FISH OIL) 1200 MG CAPS Take 1 capsule by mouth daily.  triamcinolone ointment (KENALOG) 0.1 % Apply 1 application topically 2 (two) times daily. 80 g 0   TURMERIC PO Take 300 mg by mouth daily.     No facility-administered medications prior to visit.    No Known Allergies  ROS Review of Systems  Constitutional: Negative.   HENT: Negative.    Respiratory: Negative.    Cardiovascular: Negative.   Gastrointestinal: Negative.   Musculoskeletal:  Negative for arthralgias and myalgias.  Psychiatric/Behavioral: Negative.       Objective:    Physical Exam Vitals and nursing note reviewed.  Constitutional:      General: He is not in acute distress.    Appearance: Normal appearance. He is not ill-appearing, toxic-appearing or diaphoretic.  HENT:     Head: Normocephalic and atraumatic.     Right Ear: External ear normal.     Left Ear: External ear normal.  Eyes:     General: No scleral icterus.       Right eye: No discharge.        Left eye: No discharge.     Conjunctiva/sclera: Conjunctivae normal.  Cardiovascular:     Rate and Rhythm:  Normal rate and regular rhythm.  Pulmonary:     Effort: Pulmonary effort is normal.     Breath sounds: Normal breath sounds.  Musculoskeletal:     Cervical back: No rigidity or tenderness.  Lymphadenopathy:     Cervical: No cervical adenopathy.  Skin:    General: Skin is warm and dry.  Neurological:     Mental Status: He is alert and oriented to person, place, and time.  Psychiatric:        Mood and Affect: Mood normal.        Behavior: Behavior normal.    BP 136/74   Pulse 62   Temp (!) 97.1 F (36.2 C) (Temporal)   Ht 5\' 6"  (1.676 m)   Wt 227 lb (103 kg)   SpO2 98%   BMI 36.64 kg/m  Wt Readings from Last 3 Encounters:  06/26/20 227 lb (103 kg)  03/23/20 225 lb (102.1 kg)  03/23/19 222 lb 3.2 oz (100.8 kg)     Health Maintenance Due  Topic Date Due   HIV Screening  Never done   Zoster Vaccines- Shingrix (1 of 2) Never done    There are no preventive care reminders to display for this patient.  Lab Results  Component Value Date   TSH 1.34 06/26/2020   Lab Results  Component Value Date   WBC 4.1 03/23/2020   HGB 14.5 03/23/2020   HCT 44.0 03/23/2020   MCV 89.6 03/23/2020   PLT 297.0 03/23/2020   Lab Results  Component Value Date   NA 138 06/26/2020   K 4.2 06/26/2020   CO2 28 06/26/2020   GLUCOSE 101 (H) 06/26/2020   BUN 9 06/26/2020   CREATININE 1.15 06/26/2020   BILITOT 0.8 06/26/2020   ALKPHOS 31 (L) 06/26/2020   AST 60 (H) 06/26/2020   ALT 49 06/26/2020   PROT 7.5 06/26/2020   ALBUMIN 4.4 06/26/2020   CALCIUM 9.5 06/26/2020   GFR 69.51 06/26/2020   Lab Results  Component Value Date   CHOL 107 06/26/2020   Lab Results  Component Value Date   HDL 33.00 (L) 06/26/2020   Lab Results  Component Value Date   LDLCALC 63 06/26/2020   Lab Results  Component Value Date   TRIG 58.0 06/26/2020   Lab Results  Component Value Date   CHOLHDL  3 06/26/2020   No results found for: HGBA1C    Assessment & Plan:   Problem List Items  Addressed This Visit       Other   Elevated blood pressure reading without diagnosis of hypertension   Relevant Orders   Basic metabolic panel (Completed)   TSH (Completed)   BMI 36.0-36.9,adult   Relevant Orders   TSH (Completed)   Elevated LDL cholesterol level - Primary   Relevant Orders   Hepatic function panel (Completed)   Lipid panel (Completed)   Elevated LFTs   Relevant Orders   Hepatic function panel (Completed)   Hepatitis B Surface AntiGEN   Hepatitis B surface antibody,qualitative   US Abdomen Complete    No orders of the defined types were placed in this encounter.   Follow-up: Return in about 6 months (around 12/26/2020).  Given information on BMI and calorie counting to lose weight.  Encouraged him to exercise but if it is just walking.  Suggested weight watchers.  Given information on semaglutide.  Continue atorvastatin.  Libby Maw, MD

## 2020-06-26 NOTE — Progress Notes (Signed)
Ldl cholesterol much improved with statin therapy. Lets continue it.  One of the liver enzymes remains slightly elevated. Have ordered additional blood work and would like for you to have an abdominal US to further assess liver.

## 2020-06-26 NOTE — Addendum Note (Signed)
Addended by: Jon Billings on: 06/26/2020 04:52 PM   Modules accepted: Orders

## 2020-06-27 LAB — HEPATITIS B SURFACE ANTIGEN: Hepatitis B Surface Ag: NONREACTIVE

## 2020-06-27 LAB — HEPATITIS B SURFACE ANTIBODY,QUALITATIVE: Hep B S Ab: NONREACTIVE

## 2020-06-30 NOTE — Progress Notes (Signed)
Negative for Hep B. Hep B vaccine is recommended. Please go for liver ultrasound.

## 2020-07-28 ENCOUNTER — Other Ambulatory Visit: Payer: Self-pay

## 2020-07-28 ENCOUNTER — Ambulatory Visit (HOSPITAL_BASED_OUTPATIENT_CLINIC_OR_DEPARTMENT_OTHER)
Admission: RE | Admit: 2020-07-28 | Discharge: 2020-07-28 | Disposition: A | Discharge status: Home / Self Care | Payer: 59 | Source: Ambulatory Visit | Attending: Family Medicine | Admitting: Family Medicine

## 2020-07-28 DIAGNOSIS — R7989 Other specified abnormal findings of blood chemistry: Secondary | ICD-10-CM | POA: Insufficient documentation

## 2020-07-31 NOTE — Addendum Note (Signed)
Addended by: Jon Billings on: 07/31/2020 08:01 PM   Modules accepted: Orders

## 2020-08-14 ENCOUNTER — Other Ambulatory Visit: Payer: Self-pay | Admitting: Family Medicine

## 2020-08-14 DIAGNOSIS — E7800 Pure hypercholesterolemia, unspecified: Secondary | ICD-10-CM

## 2020-08-14 NOTE — Telephone Encounter (Signed)
Last seen 06/26/20 Last OV not states to return around 12/26/20, no appt scheduled at this time.

## 2020-10-09 ENCOUNTER — Encounter: Payer: Self-pay | Admitting: Family Medicine

## 2021-03-08 ENCOUNTER — Encounter: Payer: Self-pay | Admitting: Family Medicine

## 2021-03-16 ENCOUNTER — Ambulatory Visit: Payer: Managed Care, Other (non HMO) | Admitting: Family Medicine

## 2021-03-16 ENCOUNTER — Encounter: Payer: Self-pay | Admitting: Family Medicine

## 2021-03-16 ENCOUNTER — Other Ambulatory Visit: Payer: Self-pay

## 2021-03-16 ENCOUNTER — Other Ambulatory Visit (HOSPITAL_COMMUNITY)
Admission: RE | Admit: 2021-03-16 | Discharge: 2021-03-16 | Disposition: A | Discharge status: Home / Self Care | Payer: Managed Care, Other (non HMO) | Source: Ambulatory Visit | Attending: Family Medicine | Admitting: Family Medicine

## 2021-03-16 VITALS — BP 148/96 | HR 56 | Temp 97.7°F | Ht 66.0 in | Wt 198.4 lb

## 2021-03-16 DIAGNOSIS — R030 Elevated blood-pressure reading, without diagnosis of hypertension: Secondary | ICD-10-CM

## 2021-03-16 DIAGNOSIS — Z7721 Contact with and (suspected) exposure to potentially hazardous body fluids: Secondary | ICD-10-CM | POA: Insufficient documentation

## 2021-03-16 DIAGNOSIS — E7800 Pure hypercholesterolemia, unspecified: Secondary | ICD-10-CM

## 2021-03-16 NOTE — Progress Notes (Signed)
? ?Established Patient Office Visit ? ?Subjective:  ?Patient ID: Christopher Joyce, male    DOB: [birth year]  Age: 61 y.o. MRN: [id number] ? ?CC:  ?Chief Complaint  ?Patient presents with  ? Follow-up  ? Allergies  ?   Allergies/ runny nose x 1 yr  ? ? ?HPI ?[name] presents for follow-up of his blood pressure and cholesterol.  Ran out of his atorvastatin a few months ago.  He has been resting 30 pounds intentional weight loss by reducing carbohydrate in his diet.  Significant other recently diagnosed with HPV with a Pap smear.  He has never had genital warts.  They are monogamous and have been seen each other for months. ? ?Past Medical History:  ?Diagnosis Date  ? Chicken pox   ? Headache(784.0)   ? Hyperlipidemia   ? ? ?Past Surgical History:  ?Procedure Laterality Date  ? WISDOM TOOTH EXTRACTION    ? 1 tooth taken out. Was done about 5 years ago  ? ? ?Family History  ?Problem Relation Age of Onset  ? Diabetes Mother   ? Stroke Mother   ? Hyperlipidemia Father   ? Heart attack Father 72  ? Early death Father   ? Heart disease Father   ? Heart attack Brother 20  ? Hyperlipidemia Brother   ? Early death Sister   ? Drug abuse Sister   ? Diabetes Brother   ? Stroke Paternal Grandfather   ? Heart attack Paternal Grandfather   ? Diabetes Maternal Grandmother   ? Cancer Neg Hx   ? Hypertension Neg Hx   ? Kidney disease Neg Hx   ? Colon cancer Neg Hx   ? Esophageal cancer Neg Hx   ? Colon polyps Neg Hx   ? Stomach cancer Neg Hx   ? Rectal cancer Neg Hx   ? ? ?Social History  ? ?Socioeconomic History  ? Marital status: Divorced  ?  Spouse name: Not on file  ? Number of children: 5  ? Years of education: Assoc.5  ? Highest education level: Not on file  ?Occupational History  ? Occupation: Corporate treasurer  ?  Comment: [employer]  ?Tobacco Use  ? Smoking status: Former  ? Smokeless tobacco: Never  ? Tobacco comments:  ?  quit smoking 2008  ?Vaping Use  ? Vaping Use: Never used  ?Substance and Sexual Activity  ?  Alcohol use: No  ? Drug use: No  ? Sexual activity: Yes  ?Other Topics Concern  ? Not on file  ?Social History Narrative  ? Patient lives at home with his mom Christopher Joyce.   ? Patient has 5 children.   ? Patient is Divorced.   ? Patient is working full-time.   ? Patient is right-handed  ? ?Social Determinants of Health  ? ?Financial Resource Strain: Not on file  ?Food Insecurity: Not on file  ?Transportation Needs: Not on file  ?Physical Activity: Not on file  ?Stress: Not on file  ?Social Connections: Not on file  ?Intimate Partner Violence: Not on file  ? ? ?Outpatient Medications Prior to Visit  ?Medication Sig Dispense Refill  ? aspirin 81 MG tablet Take 81 mg by mouth daily.    ? Omega-3 Fatty Acids (FISH OIL) 1200 MG CAPS Take 1 capsule by mouth daily.     ? triamcinolone ointment (KENALOG) 0.1 % Apply 1 application topically 2 (two) times daily. 80 g 0  ? TURMERIC PO Take 300 mg by mouth daily.    ?  atorvastatin (LIPITOR) 20 MG tablet TAKE 1 TABLET(20 MG) BY MOUTH DAILY (Patient not taking: Reported on 03/16/2021) 90 tablet 1  ? ?No facility-administered medications prior to visit.  ? ? ?No Known Allergies ? ?ROS ?Review of Systems  ?Constitutional: Negative.   ?HENT: Negative.    ?Eyes:  Negative for photophobia and visual disturbance.  ?Respiratory: Negative.    ?Cardiovascular: Negative.   ?Gastrointestinal: Negative.   ?Endocrine: Negative for polyphagia and polyuria.  ?Genitourinary: Negative.   ?Musculoskeletal:  Negative for gait problem and joint swelling.  ?Skin: Negative.   ?Neurological:  Negative for speech difficulty, weakness, light-headedness and headaches.  ?Psychiatric/Behavioral: Negative.    ? ?  ?Objective:  ?  ?Physical Exam ?Vitals and nursing note reviewed.  ?Constitutional:   ?   General: He is not in acute distress. ?   Appearance: Normal appearance. He is not ill-appearing, toxic-appearing or diaphoretic.  ?HENT:  ?   Head: Normocephalic and atraumatic.  ?   Right Ear: External ear normal.   ?   Left Ear: External ear normal.  ?   Mouth/Throat:  ?   Mouth: Mucous membranes are moist.  ?   Pharynx: Oropharynx is clear. No oropharyngeal exudate or posterior oropharyngeal erythema.  ?Eyes:  ?   Extraocular Movements: Extraocular movements intact.  ?   Conjunctiva/sclera: Conjunctivae normal.  ?   Pupils: Pupils are equal, round, and reactive to light.  ?Cardiovascular:  ?   Rate and Rhythm: Normal rate and regular rhythm.  ?Pulmonary:  ?   Effort: Pulmonary effort is normal.  ?   Breath sounds: Normal breath sounds.  ?Abdominal:  ?   Hernia: There is no hernia in the left inguinal area or right inguinal area.  ?Genitourinary: ?   Penis: No hypospadias, erythema, tenderness, discharge, swelling or lesions.   ?   Testes:     ?   Right: Mass, tenderness or swelling not present. Right testis is descended.     ?   Left: Mass, tenderness or swelling not present. Left testis is descended.  ?   Epididymis:  ?   Right: Not inflamed or enlarged.  ?   Left: Not inflamed or enlarged.  ?Musculoskeletal:  ?   Cervical back: No rigidity or tenderness.  ?Lymphadenopathy:  ?   Cervical: No cervical adenopathy.  ?   Lower Body: No right inguinal adenopathy. No left inguinal adenopathy.  ?Neurological:  ?   Mental Status: He is alert.  ? ? ?BP (!) 148/96 (BP Location: Left Arm, Patient Position: Sitting, Cuff Size: Normal)   Pulse (!) 56   Temp 97.7 ?F (36.5 ?C) (Temporal)   Ht '5\' 6"'$  (1.676 m)   Wt 198 lb 6.4 oz (90 kg)   SpO2 96%   BMI 32.02 kg/m?  ?Wt Readings from Last 3 Encounters:  ?03/16/21 198 lb 6.4 oz (90 kg)  ?06/26/20 227 lb (103 kg)  ?03/23/20 225 lb (102.1 kg)  ? ? ? ?Health Maintenance Due  ?Topic Date Due  ? HIV Screening  Never done  ? Zoster Vaccines- Shingrix (1 of 2) Never done  ? ? ?There are no preventive care reminders to display for this patient. ? ?Lab Results  ?Component Value Date  ? TSH 1.34 06/26/2020  ? ?Lab Results  ?Component Value Date  ? WBC 4.1 03/23/2020  ? HGB 14.5 03/23/2020  ? HCT  44.0 03/23/2020  ? MCV 89.6 03/23/2020  ? PLT 297.0 03/23/2020  ? ?Lab Results  ?Component Value Date  ?  NA 138 06/26/2020  ? K 4.2 06/26/2020  ? CO2 28 06/26/2020  ? GLUCOSE 101 (H) 06/26/2020  ? BUN 9 06/26/2020  ? CREATININE 1.15 06/26/2020  ? BILITOT 0.8 06/26/2020  ? ALKPHOS 31 (L) 06/26/2020  ? AST 60 (H) 06/26/2020  ? ALT 49 06/26/2020  ? PROT 7.5 06/26/2020  ? ALBUMIN 4.4 06/26/2020  ? CALCIUM 9.5 06/26/2020  ? GFR 69.51 06/26/2020  ? ?Lab Results  ?Component Value Date  ? CHOL 107 06/26/2020  ? ?Lab Results  ?Component Value Date  ? HDL 33.00 (L) 06/26/2020  ? ?Lab Results  ?Component Value Date  ? Ward 63 06/26/2020  ? ?Lab Results  ?Component Value Date  ? TRIG 58.0 06/26/2020  ? ?Lab Results  ?Component Value Date  ? CHOLHDL 3 06/26/2020  ? ?No results found for: HGBA1C ? ?  ?Assessment & Plan:  ? ?Problem List Items Addressed This Visit   ? ?  ? Other  ? Elevated blood pressure reading without diagnosis of hypertension  ? Relevant Orders  ? CBC  ? Comprehensive metabolic panel  ? Urinalysis, Routine w reflex microscopic  ? Elevated LDL cholesterol level - Primary  ? Relevant Orders  ? Comprehensive metabolic panel  ? LDL cholesterol, direct  ? Lipid panel  ? History of potentially hazardous body fluid exposure  ? Relevant Orders  ? HIV Antibody (routine testing w rflx)  ? Urine cytology ancillary only  ? RPR  ? ? ?No orders of the defined types were placed in this encounter. ? ? ?Follow-up: Return in about 3 months (around 06/16/2021).  ? ?Given information about preventing hypertension.  He will purchase a cough and check his blood pressures periodically.  I am disappointed that his weight loss did not lead to a naturally lower blood pressure.  Will return in 3 months for a complete physical.  Will restart atorvastatin pending lab.  Return in 3 months for physical. ?Libby Maw, MD ?

## 2021-03-17 LAB — CBC
Hematocrit: 48.4 % (ref 37.5–51.0)
Hemoglobin: 16.9 g/dL (ref 13.0–17.7)
MCH: 30.7 pg (ref 26.6–33.0)
MCHC: 34.9 g/dL (ref 31.5–35.7)
MCV: 88 fL (ref 79–97)
Platelets: 215 10*3/uL (ref 150–450)
RBC: 5.51 x10E6/uL (ref 4.14–5.80)
RDW: 13.3 % (ref 11.6–15.4)
WBC: 4.4 10*3/uL (ref 3.4–10.8)

## 2021-03-17 LAB — URINALYSIS, ROUTINE W REFLEX MICROSCOPIC
Bilirubin, UA: NEGATIVE
Glucose, UA: NEGATIVE
Ketones, UA: NEGATIVE
Leukocytes,UA: NEGATIVE
Nitrite, UA: NEGATIVE
Protein,UA: NEGATIVE
RBC, UA: NEGATIVE
Specific Gravity, UA: 1.014 (ref 1.005–1.030)
Urobilinogen, Ur: 0.2 mg/dL (ref 0.2–1.0)
pH, UA: 5.5 (ref 5.0–7.5)

## 2021-03-17 LAB — COMPREHENSIVE METABOLIC PANEL
ALT: 27 IU/L (ref 0–44)
AST: 34 IU/L (ref 0–40)
Albumin/Globulin Ratio: 1.4 (ref 1.2–2.2)
Albumin: 4.4 g/dL (ref 3.8–4.9)
Alkaline Phosphatase: 38 IU/L — ABNORMAL LOW (ref 44–121)
BUN/Creatinine Ratio: 9 — ABNORMAL LOW (ref 10–24)
BUN: 10 mg/dL (ref 8–27)
Bilirubin Total: 0.6 mg/dL (ref 0.0–1.2)
CO2: 23 mmol/L (ref 20–29)
Calcium: 9.9 mg/dL (ref 8.6–10.2)
Chloride: 105 mmol/L (ref 96–106)
Creatinine, Ser: 1.09 mg/dL (ref 0.76–1.27)
Globulin, Total: 3.1 g/dL (ref 1.5–4.5)
Glucose: 82 mg/dL (ref 70–99)
Potassium: 4.5 mmol/L (ref 3.5–5.2)
Sodium: 142 mmol/L (ref 134–144)
Total Protein: 7.5 g/dL (ref 6.0–8.5)
eGFR: 78 mL/min/{1.73_m2} (ref >59)

## 2021-03-17 LAB — LIPID PANEL
Chol/HDL Ratio: 4.1 ratio (ref 0.0–5.0)
Cholesterol, Total: 211 mg/dL — ABNORMAL HIGH (ref 100–199)
HDL: 52 mg/dL (ref >39)
LDL Chol Calc (NIH): 148 mg/dL — ABNORMAL HIGH (ref 0–99)
Triglycerides: 63 mg/dL (ref 0–149)
VLDL Cholesterol Cal: 11 mg/dL (ref 5–40)

## 2021-03-17 LAB — RPR: RPR Ser Ql: NONREACTIVE

## 2021-03-17 LAB — LDL CHOLESTEROL, DIRECT: LDL Direct: 149 mg/dL — ABNORMAL HIGH (ref 0–99)

## 2021-03-19 LAB — HIV ANTIBODY (ROUTINE TESTING W REFLEX): HIV 1&2 Ab, 4th Generation: NONREACTIVE

## 2021-03-20 LAB — URINE CYTOLOGY ANCILLARY ONLY
Chlamydia: NEGATIVE
Comment: NEGATIVE

## 2021-06-15 ENCOUNTER — Telehealth: Payer: Self-pay | Admitting: Family Medicine

## 2021-06-15 DIAGNOSIS — E7800 Pure hypercholesterolemia, unspecified: Secondary | ICD-10-CM

## 2021-06-15 MED ORDER — ATORVASTATIN CALCIUM 20 MG PO TABS: ORAL_TABLET | ORAL | 1 refills | Status: DC

## 2021-06-15 NOTE — Telephone Encounter (Signed)
Patient calling with concerns about allergies and his consistent runny nose. Would like referral to allergist. Okay to place referral?

## 2021-06-15 NOTE — Telephone Encounter (Signed)
Pt stated that his runny nose is not satisfied with OTC meds. He would maybe like a referral to an allergist.  He would also like his Atorvastatin refilled at the [facility] on [address].

## 2021-06-18 MED ORDER — ATORVASTATIN CALCIUM 20 MG PO TABS: ORAL_TABLET | ORAL | 1 refills | Status: AC

## 2021-06-18 NOTE — Telephone Encounter (Signed)
Patient aware that refill sent in, per patient he will hold on referral until he comes in for follow up appointment scheduled 07/19/21

## 2021-06-19 ENCOUNTER — Ambulatory Visit: Payer: Managed Care, Other (non HMO) | Admitting: Family Medicine

## 2021-07-19 ENCOUNTER — Ambulatory Visit: Payer: Managed Care, Other (non HMO) | Admitting: Family Medicine

## 2021-07-26 ENCOUNTER — Ambulatory Visit: Payer: Managed Care, Other (non HMO) | Admitting: Family Medicine

## 2021-08-06 ENCOUNTER — Ambulatory Visit: Payer: Managed Care, Other (non HMO) | Admitting: Family Medicine

## 2021-08-20 ENCOUNTER — Ambulatory Visit: Payer: Managed Care, Other (non HMO) | Admitting: Family Medicine

## 2021-09-03 ENCOUNTER — Ambulatory Visit: Payer: Managed Care, Other (non HMO) | Admitting: Family Medicine

## 2021-09-06 ENCOUNTER — Ambulatory Visit: Payer: Managed Care, Other (non HMO) | Admitting: Family Medicine

## 2022-04-05 IMAGING — US US ABDOMEN COMPLETE
1 series · 13 of 25 positions shown · non-contrast
Comparison: None.

CLINICAL DATA: elevated liver function tests.

EXAM:
ABDOMEN ULTRASOUND COMPLETE

[Series 1: us abdomen complete · 13 of 113 slices shown]
[im 1/113]
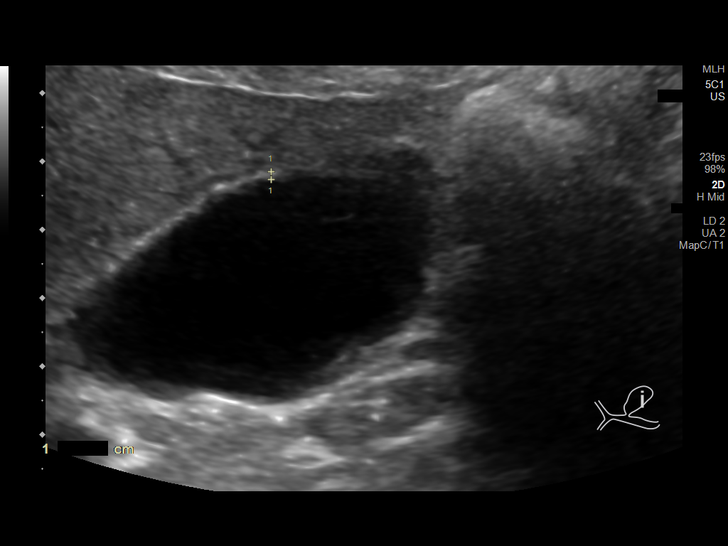
[im 10/113]
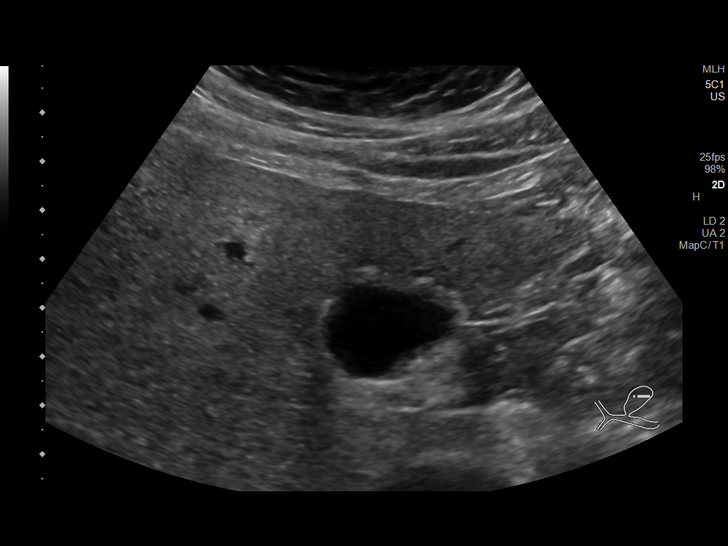
[im 19/113]
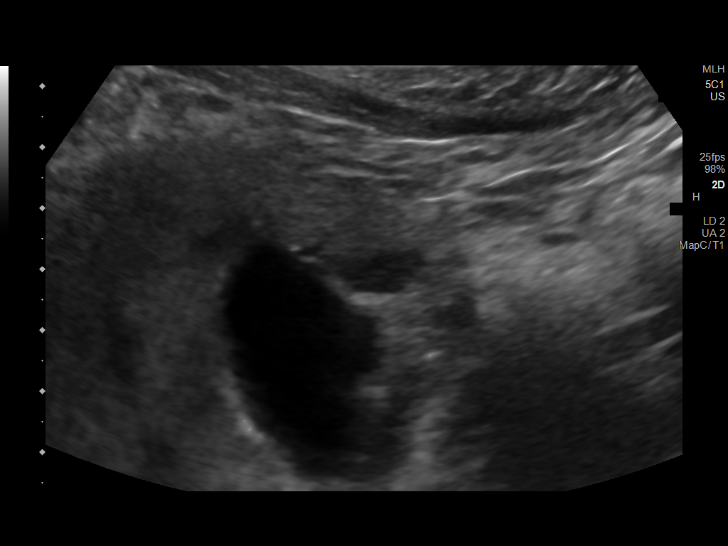
[im 29/113]
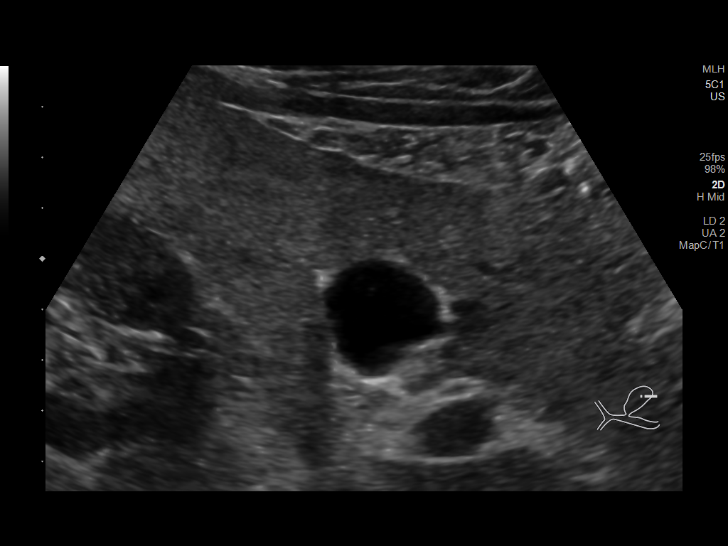
[im 38/113]
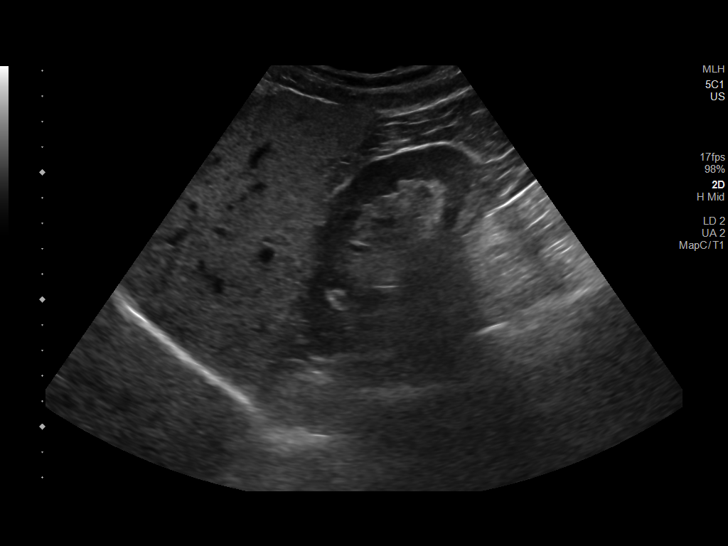
[im 47/113]
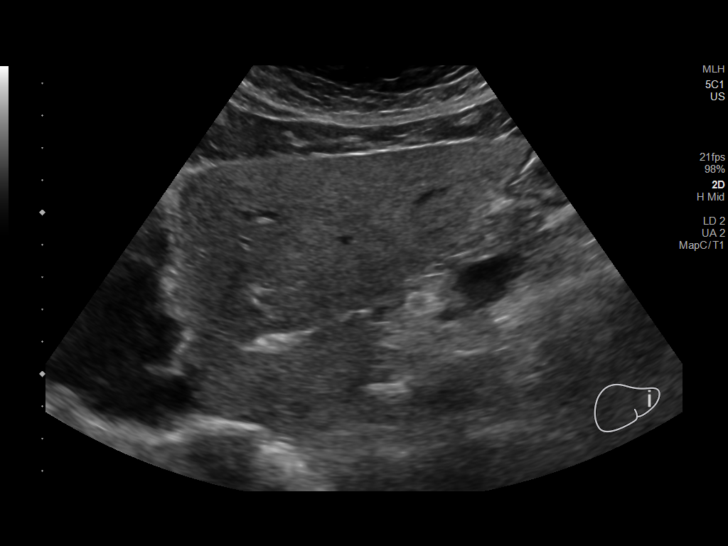
[im 57/113]
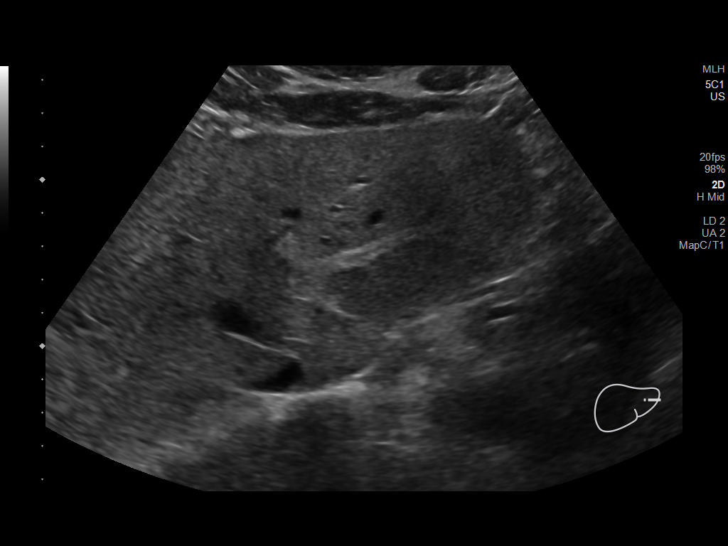
[im 66/113]
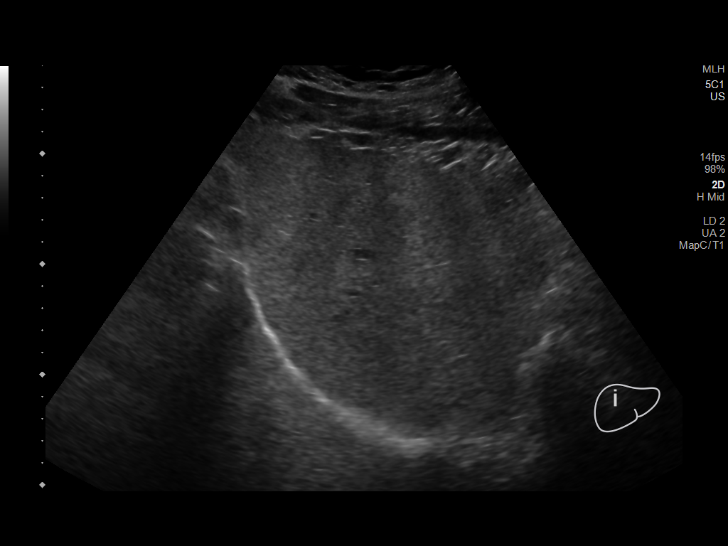
[im 75/113]
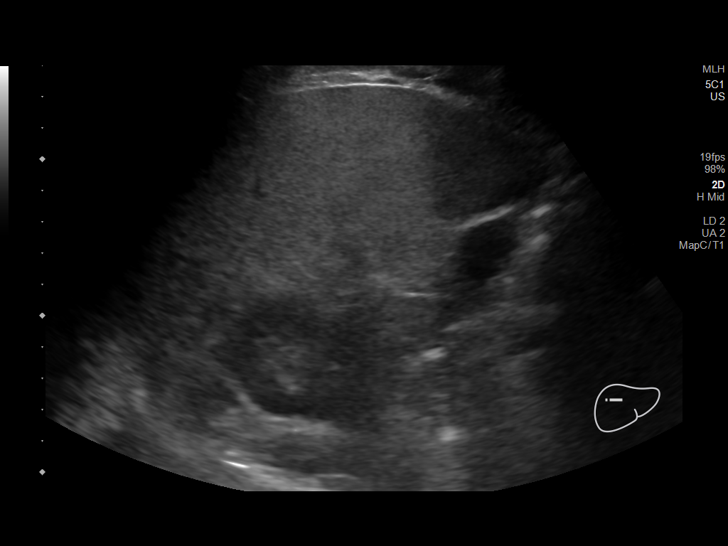
[im 85/113]
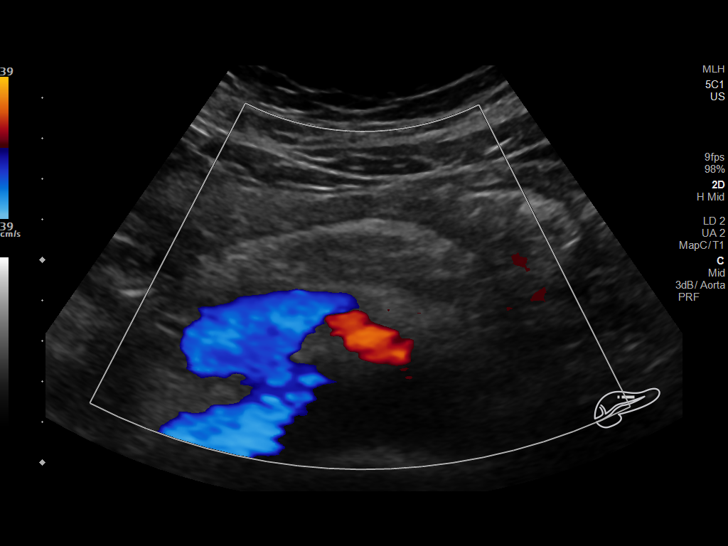
[im 94/113]
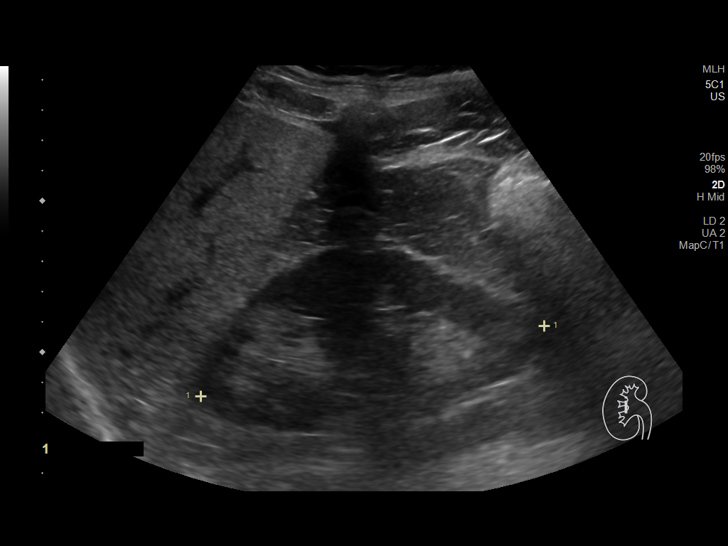
[im 103/113]
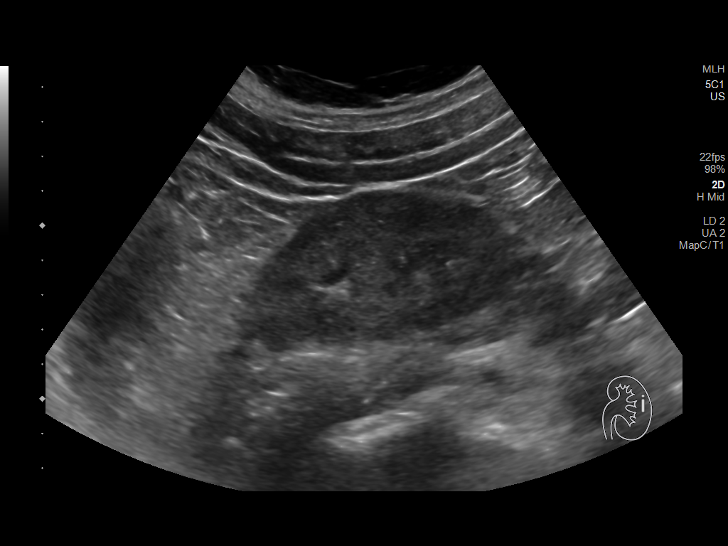
[im 113/113]
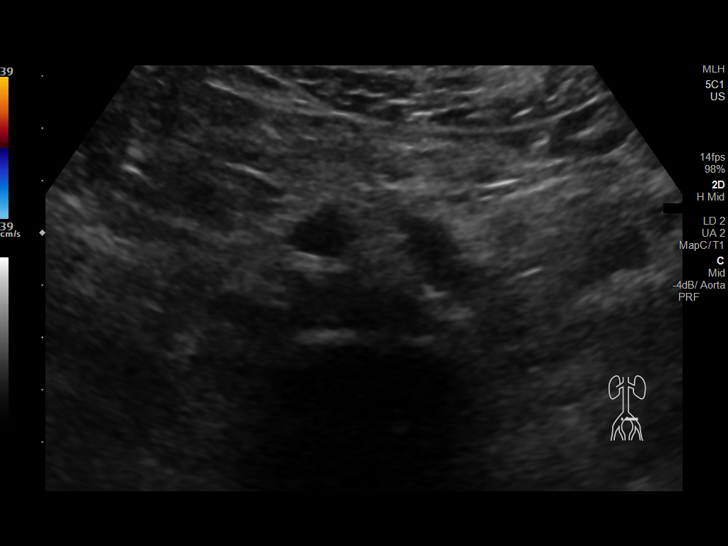

[13 of 25 positions shown; findings below may reference images not displayed]

FINDINGS: Gallbladder: Nonshadowing, non mobile echogenic focus along the
gallbladder wall measuring up to 7.7 mm.

Common bile duct: Diameter: 1.9 mm

Liver: Diffusely increased liver echogenicity. Focal hypoechoic area
along the gallbladder fossa measuring 1.5 x 0.7 x 1.5 cm likely
representing focal fatty sparing. Portal vein is patent on color
Doppler imaging with normal direction of blood flow towards the
liver.

IVC: No abnormality visualized.

Pancreas: Visualized portion unremarkable.

Spleen: Size and appearance within normal limits.

Right Kidney: Length: 11.6 cm. Echogenicity within normal limits. No
mass or hydronephrosis visualized.

Left Kidney: Length: 10.2 cm. Echogenicity within normal limits. No
mass or hydronephrosis visualized.

Abdominal aorta: No aneurysm visualized.

Other findings: None.
IMPRESSION: Diffuse hepatic steatosis with likely focal fatty sparing along the
gallbladder fossa.

7.7 mm gallbladder polyp versus sludge along the posterior
gallbladder wall. Recommend follow-up ultrasound in 12 months. This
recommendation follows ACR consensus guidelines: White Paper of the
ACR Incidental findings Committee II on Gallbladder and Biliary
Findings. [HOSPITAL] 8565:;[DATE].

## 2022-07-02 ENCOUNTER — Ambulatory Visit: Payer: 59 | Admitting: Internal Medicine

## 2022-07-02 ENCOUNTER — Encounter: Payer: Self-pay | Admitting: Internal Medicine

## 2022-07-02 VITALS — BP 144/86 | HR 61 | Temp 98.1°F | Ht 66.0 in | Wt 218.0 lb

## 2022-07-02 DIAGNOSIS — M10072 Idiopathic gout, left ankle and foot: Secondary | ICD-10-CM | POA: Diagnosis not present

## 2022-07-02 MED ORDER — PREDNISONE 10 MG (21) PO TBPK
ORAL_TABLET | ORAL | 0 refills | Status: AC
Start: 2022-07-02 — End: ?

## 2022-07-02 MED ORDER — INDOMETHACIN 50 MG PO CAPS
50.0000 mg | ORAL_CAPSULE | Freq: Three times a day (TID) | ORAL | 0 refills | Status: AC
Start: 2022-07-02 — End: 2022-07-07

## 2022-07-02 NOTE — Progress Notes (Signed)
[facility] [address] [place] [address] Dept: [phone] Dept Fax: [phone]  Acute Care Office Visit  Subjective:   Christopher Joyce Mar 22, 1960 07/02/2022  Chief Complaint  Patient presents with   Foot Pain    Started a week ago,  last two day swelling alot   Foot Swelling    HPI: Christopher Joyce presents complaining of left foot pain onset 1 week ago. Reports onset was sudden while laying in bed. Pain has worsened since then. Associated swelling, pain with walking. Now limping when walking due to pain. Has taken ibuprofen and tylenol with no relief of pain.  No recent injury or falls. Denies recent ETOH or purine food consumption.  No current hx of gout.     The following portions of the patient's history were reviewed and updated as appropriate: past medical history, past surgical history, family history, social history, allergies, medications, and problem list.   Patient Active Problem List   Diagnosis Date Noted   History of potentially hazardous body fluid exposure 03/16/2021   Elevated LFTs 03/24/2020   Ceruminosis, right 03/23/2019   Elevated LDL cholesterol level 09/22/2018   Need for influenza vaccination 09/22/2018   Healthcare maintenance 03/18/2018   Eczema 03/18/2018   Overweight 03/18/2018   BMI 36.0-36.9,adult 03/18/2018   Piriformis syndrome of left side 12/15/2013   Right lumbar radiculitis 10/04/2013   Elevated blood pressure reading without diagnosis of hypertension 01/22/2013   Abnormal MRI of head 08/25/2012   Headache(784.0) 08/19/2012   Hyperlipidemia LDL goal < 130 08/19/2012   Past Medical History:  Diagnosis Date   Chicken pox    Headache(784.0)    Hyperlipidemia    Past Surgical History:  Procedure Laterality Date   WISDOM TOOTH EXTRACTION     1 tooth taken out. Was done about 5 years ago   Family History  Problem Relation Age of Onset   Diabetes Mother    Stroke  Mother    Hyperlipidemia Father    Heart attack Father 72   Early death Father    Heart disease Father    Heart attack Brother 7   Hyperlipidemia Brother    Early death Sister    Drug abuse Sister    Diabetes Brother    Stroke Paternal Grandfather    Heart attack Paternal Grandfather    Diabetes Maternal Grandmother    Cancer Neg Hx    Hypertension Neg Hx    Kidney disease Neg Hx    Colon cancer Neg Hx    Esophageal cancer Neg Hx    Colon polyps Neg Hx    Stomach cancer Neg Hx    Rectal cancer Neg Hx    Outpatient Medications Prior to Visit  Medication Sig Dispense Refill   aspirin 81 MG tablet Take 81 mg by mouth daily.     TURMERIC PO Take 300 mg by mouth daily.     atorvastatin (LIPITOR) 20 MG tablet TAKE 1 TABLET(20 MG) BY MOUTH DAILY (Patient not taking: Reported on 07/02/2022) 90 tablet 1   Omega-3 Fatty Acids (FISH OIL) 1200 MG CAPS Take 1 capsule by mouth daily.  (Patient not taking: Reported on 07/02/2022)     triamcinolone ointment (KENALOG) 0.1 % Apply 1 application topically 2 (two) times daily. (Patient not taking: Reported on 07/02/2022) 80 g 0   No facility-administered medications prior to visit.   No Known Allergies   ROS: A complete ROS was performed with pertinent positives/negatives noted in the  HPI. The remainder of the ROS are negative.    Objective:   Today's Vitals   07/02/22 1522  BP: (!) 144/86  Pulse: 61  Temp: 98.1 F (36.7 C)  TempSrc: Temporal  SpO2: 98%  Weight: 218 lb (98.9 kg)  Height: 5\' 6"  (1.676 m)    GENERAL: Well-appearing, in NAD. Well nourished.  SKIN: Pink, warm and dry. No rash, lesion, ulceration, or ecchymoses.  RESPIRATORY: Chest wall symmetrical. Respirations even and non-labored. CARDIAC:Peripheral pulses 2+ bilaterally.  MSK: Muscle tone and strength appropriate for age.  EXTREMITIES: Without clubbing, cyanosis. Swelling, warmth, tenderness and erythema to Left ankle . Limited ROM secondary to pain and swelling.   NEUROLOGIC: No motor or sensory deficits. Steady, even gait.  PSYCH/MENTAL STATUS: Alert, oriented x 3. Cooperative, appropriate mood and affect.    No results found for any visits on 07/02/22.    Assessment & Plan:  1. Acute idiopathic gout of left ankle - predniSONE (STERAPRED UNI-PAK 21 TAB) 10 MG (21) TBPK tablet; Take as directed on packaging.  Dispense: 21 tablet; Refill: 0 - indomethacin (INDOCIN) 50 MG capsule; Take 1 capsule (50 mg total) by mouth 3 (three) times daily with meals for 5 days.  Dispense: 15 capsule; Refill: 0 - Uric acid   Meds ordered this encounter  Medications   predniSONE (STERAPRED UNI-PAK 21 TAB) 10 MG (21) TBPK tablet    Sig: Take as directed on packaging.    Dispense:  21 tablet    Refill:  0    Order Specific Question:   Supervising Provider    Answer:   Garnette Gunner [[id number]]   indomethacin (INDOCIN) 50 MG capsule    Sig: Take 1 capsule (50 mg total) by mouth 3 (three) times daily with meals for 5 days.    Dispense:  15 capsule    Refill:  0    Order Specific Question:   Supervising Provider    Answer:   Garnette Gunner [[id number]]   Lab Orders         Uric acid     No images are attached to the encounter or orders placed in the encounter.  Return if symptoms worsen or fail to improve.   Salvatore Decent, FNP

## 2022-07-03 LAB — URIC ACID: Uric Acid, Serum: 7.7 mg/dL (ref 4.0–7.8)

## 2022-08-16 ENCOUNTER — Encounter: Payer: Self-pay | Admitting: Family Medicine
